# Patient Record
Sex: Female | Born: 1990 | Race: Asian | Hispanic: No | Marital: Married | Smoking: Never smoker
Health system: Southern US, Community
[De-identification: ages and names within clinical notes are randomized; demographics above are authoritative.]

## PROBLEM LIST (undated history)

## (undated) DIAGNOSIS — Q963 Mosaicism, 45, X/46, XX or XY: Secondary | ICD-10-CM

## (undated) DIAGNOSIS — D649 Anemia, unspecified: Secondary | ICD-10-CM

## (undated) DIAGNOSIS — O4400 Placenta previa specified as without hemorrhage, unspecified trimester: Secondary | ICD-10-CM

## (undated) HISTORY — DX: Complete placenta previa nos or without hemorrhage, unspecified trimester: O44.00

## (undated) HISTORY — DX: Mosaicism, 45, X/46, XX or XY: Q96.3

## (undated) HISTORY — PX: APPENDECTOMY: SHX54

## (undated) HISTORY — DX: Anemia, unspecified: D64.9

## (undated) HISTORY — PX: ADENOIDECTOMY: SUR15

---

## 2020-04-03 DIAGNOSIS — Z1331 Encounter for screening for depression: Secondary | ICD-10-CM | POA: Diagnosis not present

## 2020-04-03 DIAGNOSIS — Z01419 Encounter for gynecological examination (general) (routine) without abnormal findings: Secondary | ICD-10-CM | POA: Diagnosis not present

## 2020-04-13 DIAGNOSIS — Z20822 Contact with and (suspected) exposure to covid-19: Secondary | ICD-10-CM | POA: Diagnosis not present

## 2020-05-11 DIAGNOSIS — Z20822 Contact with and (suspected) exposure to covid-19: Secondary | ICD-10-CM | POA: Diagnosis not present

## 2020-05-11 DIAGNOSIS — Z03818 Encounter for observation for suspected exposure to other biological agents ruled out: Secondary | ICD-10-CM | POA: Diagnosis not present

## 2020-05-16 DIAGNOSIS — K353 Acute appendicitis with localized peritonitis, without perforation or gangrene: Secondary | ICD-10-CM | POA: Diagnosis not present

## 2020-05-16 DIAGNOSIS — K3532 Acute appendicitis with perforation and localized peritonitis, without abscess: Secondary | ICD-10-CM | POA: Diagnosis not present

## 2020-05-16 DIAGNOSIS — Z98891 History of uterine scar from previous surgery: Secondary | ICD-10-CM | POA: Diagnosis not present

## 2020-05-16 DIAGNOSIS — K3580 Unspecified acute appendicitis: Secondary | ICD-10-CM | POA: Diagnosis not present

## 2020-05-16 DIAGNOSIS — D72829 Elevated white blood cell count, unspecified: Secondary | ICD-10-CM | POA: Diagnosis not present

## 2020-07-03 DIAGNOSIS — R509 Fever, unspecified: Secondary | ICD-10-CM | POA: Diagnosis not present

## 2020-07-03 DIAGNOSIS — Z20828 Contact with and (suspected) exposure to other viral communicable diseases: Secondary | ICD-10-CM | POA: Diagnosis not present

## 2020-07-03 DIAGNOSIS — J029 Acute pharyngitis, unspecified: Secondary | ICD-10-CM | POA: Diagnosis not present

## 2020-12-25 DIAGNOSIS — Z30432 Encounter for removal of intrauterine contraceptive device: Secondary | ICD-10-CM | POA: Diagnosis not present

## 2021-08-03 ENCOUNTER — Other Ambulatory Visit: Payer: Self-pay | Admitting: *Deleted

## 2021-08-03 DIAGNOSIS — Z8759 Personal history of other complications of pregnancy, childbirth and the puerperium: Secondary | ICD-10-CM | POA: Insufficient documentation

## 2021-08-03 DIAGNOSIS — Z3480 Encounter for supervision of other normal pregnancy, unspecified trimester: Secondary | ICD-10-CM | POA: Insufficient documentation

## 2021-08-16 ENCOUNTER — Encounter: Payer: Self-pay | Admitting: *Deleted

## 2021-08-24 ENCOUNTER — Encounter: Payer: Self-pay | Admitting: *Deleted

## 2021-08-25 ENCOUNTER — Ambulatory Visit (INDEPENDENT_AMBULATORY_CARE_PROVIDER_SITE_OTHER): Payer: BC Managed Care – PPO | Admitting: Advanced Practice Midwife

## 2021-08-25 ENCOUNTER — Other Ambulatory Visit: Payer: Self-pay

## 2021-08-25 ENCOUNTER — Other Ambulatory Visit (HOSPITAL_COMMUNITY)
Admission: RE | Admit: 2021-08-25 | Discharge: 2021-08-25 | Disposition: A | Discharge status: Home / Self Care | Payer: BC Managed Care – PPO | Source: Ambulatory Visit | Attending: Advanced Practice Midwife | Admitting: Advanced Practice Midwife

## 2021-08-25 ENCOUNTER — Encounter: Payer: Self-pay | Admitting: Advanced Practice Midwife

## 2021-08-25 VITALS — BP 121/75 | HR 100 | Ht 64.0 in | Wt 159.0 lb

## 2021-08-25 DIAGNOSIS — Z3480 Encounter for supervision of other normal pregnancy, unspecified trimester: Secondary | ICD-10-CM | POA: Diagnosis not present

## 2021-08-25 DIAGNOSIS — Z98891 History of uterine scar from previous surgery: Secondary | ICD-10-CM | POA: Insufficient documentation

## 2021-08-25 DIAGNOSIS — O09891 Supervision of other high risk pregnancies, first trimester: Secondary | ICD-10-CM

## 2021-08-25 DIAGNOSIS — Z2839 Other underimmunization status: Secondary | ICD-10-CM

## 2021-08-25 DIAGNOSIS — Z8759 Personal history of other complications of pregnancy, childbirth and the puerperium: Secondary | ICD-10-CM | POA: Diagnosis not present

## 2021-08-25 DIAGNOSIS — Z3A08 8 weeks gestation of pregnancy: Secondary | ICD-10-CM | POA: Diagnosis not present

## 2021-08-25 DIAGNOSIS — O09899 Supervision of other high risk pregnancies, unspecified trimester: Secondary | ICD-10-CM

## 2021-08-25 NOTE — Progress Notes (Signed)
?  ? ?Subjective:  ? ?Brianna Farnan is a 31 y.o. G2P1001 at [redacted]w[redacted]d by LMP, c/w bedside US in office today, being seen today for her first obstetrical visit.  Her obstetrical history is significant for  partial abruption, SGA baby, and placenta with area of necrosis at delivery with previous pregnancy, cesarean section for fetal intolerance of labor   and has Supervision of other normal pregnancy, antepartum and History of placenta previa on their problem list.. Patient does intend to breast feed. Pregnancy history fully reviewed. ? ?Patient reports nausea. ? ?HISTORY: ?OB History  ?Gravida Para Term Preterm AB Living  ?2 1 1  0 0 1  ?SAB IAB Ectopic Multiple Live Births  ?0 0 0 0 1  ?  ?# Outcome Date GA Lbr Len/2nd Weight Sex Delivery Anes PTL Lv  ?2 Current           ?1 Term 11/25/18     CS-LTranv   LIV  ? ?Past Medical History:  ?Diagnosis Date  ?? Anemia   ?? Placenta previa   ? ?Past Surgical History:  ?Procedure Laterality Date  ?? APPENDECTOMY    ? ?Family History  ?Problem Relation Age of Onset  ?? Diabetes Mother   ?? Hyperthyroidism Mother   ?? Hypertension Father   ? ?Social History  ? ?Tobacco Use  ?? Smoking status: Never  ?? Smokeless tobacco: Never  ?Vaping Use  ?? Vaping Use: Never used  ?Substance Use Topics  ?? Alcohol use: Not Currently  ?? Drug use: Never  ? ?Not on File ?No current outpatient medications on file prior to visit.  ? ?No current facility-administered medications on file prior to visit.  ? ? ? Indications for ASA therapy (per uptodate) ?One of the following: ?Previous pregnancy with preeclampsia, especially early onset and with an adverse outcome No ?Multifetal gestation No ?Chronic hypertension No ?Type 1 or 2 diabetes mellitus No ?Chronic kidney disease No ?Autoimmune disease (antiphospholipid syndrome, systemic lupus erythematosus) No ? ? Two or more of the following: ?Nulliparity No ?Obesity (body mass index >30 kg/m2) No ?Family history of preeclampsia in mother or sister No ?Age  ?35 years No ?Sociodemographic characteristics (African American race, low socioeconomic level) No ?Personal risk factors (eg, previous pregnancy with low birth weight or small for gestational age infant, previous adverse pregnancy outcome [eg, stillbirth], interval >10 years between pregnancies) No ? ? Indications for early 1 hour GTT (per uptodate) ? ?BMI >25 (>23 in Asian women) AND one of the following ? ?Gestational diabetes mellitus in a previous pregnancy No ?Glycated hemoglobin ?5.7 percent (39 mmol/mol), impaired glucose tolerance, or impaired fasting glucose on previous testing No ?First-degree relative with diabetes No ?High-risk race/ethnicity (eg, African American, Latino, Native American, [place] American, Pacific Islander) No ?History of cardiovascular disease No ?Hypertension or on therapy for hypertension No ?High-density lipoprotein cholesterol level <35 mg/dL (2.11 mmol/L) and/or a triglyceride level >250 mg/dL (1.55 mmol/L) No ?Polycystic ovary syndrome No ?Physical inactivity No ?Other clinical condition associated with insulin resistance (eg, severe obesity, acanthosis nigricans) No ?Previous birth of an infant weighing ?4000 g No ?Previous stillbirth of unknown cause No ?Exam  ? ?Vitals:  ? 08/24/21 0823 08/25/21 1425  ?BP:  121/75  ?Pulse:  100  ?Weight:  159 lb (72.1 kg)  ?Height: 5\' 4"  (1.626 m)   ? ?  ? ?Uterus:     ?Pelvic Exam: Perineum: no hemorrhoids, normal perineum  ? Vulva: normal external genitalia, no lesions  ? Vagina:  normal mucosa,  normal discharge  ? Cervix: no lesions and normal, pap smear done.   ? Adnexa: normal adnexa and no mass, fullness, tenderness  ? Bony Pelvis: average  ?System: General: well-developed, well-nourished female in no acute distress  ? Breast:  normal appearance, no masses or tenderness  ? Skin: normal coloration and turgor, no rashes  ? Neurologic: oriented, normal, negative, normal mood  ? Extremities: normal strength, tone, and muscle mass, ROM of all  joints is normal  ? HEENT PERRLA, extraocular movement intact and sclera clear, anicteric  ? Mouth/Teeth mucous membranes moist, pharynx normal without lesions and dental hygiene good  ? Neck supple and no masses  ? Cardiovascular: regular rate and rhythm  ? Respiratory:  no respiratory distress, normal breath sounds  ? Abdomen: soft, non-tender; bowel sounds normal; no masses,  no organomegaly  ? ?  ?Assessment:  ? ?Pregnancy: G2P1001 ?Patient Active Problem List  ? Diagnosis Date Noted  ?? Supervision of other normal pregnancy, antepartum 08/03/2021  ?? History of placenta previa 08/03/2021  ? ?  ?Plan:  ? ?1. Supervision of other normal pregnancy, antepartum ?--Anticipatory guidance about next visits/weeks of pregnancy given.  ?--next appt in 4 weeks ?--NIPS labs in 3-4 weeks ?--Pt with Pap at previous provider, will review records request additional records PRN ? ?- Culture, OB Urine ?- Obstetric panel ?- HIV antibody (with reflex) ?- Hepatitis C Antibody ?- [facility] AMB BABYSCRIPTS OPT IN ?- GC/Chlamydia probe amp ([place])not at [facility] ? ?2. History of placenta abruption ?--Pt with bleeding prior to delivery, on light duty then modified bed rest, partial previa resolved but 25% abruption noted on [place].  Area of necrosis noted in placenta at time of delivery. Full term infant 5lb 8 oz did well.  ? ?3. History of cesarean delivery ?--For fetal intolerance of labor.  Currently desires repeat C/S but may want to discuss options.  ? ?Initial labs drawn. ?Continue prenatal vitamins. ?Discussed and offered genetic screening options, including Quad screen/AFP, NIPS testing, and option to decline testing. Benefits/risks/alternatives reviewed. Pt aware that anatomy US is form of genetic screening with lower accuracy in detecting trisomies than blood work.  Pt chooses genetic screening today. NIPS: requested. ?Ultrasound discussed; fetal anatomic survey: requested. ?Problem list reviewed and updated. ?The nature of [place] with multiple MDs and other Advanced Practice Providers was explained to patient; also emphasized that residents, students are part of our team. ?Routine obstetric precautions reviewed. ?Return in about 4 weeks (around 09/22/2021). ? ? ?Fatima Blank, CNM ?08/25/21 ?5:29 PM  ?

## 2021-08-26 LAB — GC/CHLAMYDIA PROBE AMP (~~LOC~~) NOT AT ARMC
Chlamydia: NEGATIVE
Comment: NEGATIVE
Comment: NORMAL
Neisseria Gonorrhea: NEGATIVE

## 2021-08-27 LAB — OBSTETRIC PANEL
Absolute Monocytes: 491 cells/uL (ref 200–950)
Antibody Screen: NOT DETECTED
Basophils Absolute: 44 cells/uL (ref 0–200)
Basophils Relative: 0.4 %
Eosinophils Absolute: 185 cells/uL (ref 15–500)
Eosinophils Relative: 1.7 %
HCT: 40.5 % (ref 35.0–45.0)
Hemoglobin: 13.8 g/dL (ref 11.7–15.5)
Hepatitis B Surface Ag: NONREACTIVE
Lymphs Abs: 2006 cells/uL (ref 850–3900)
MCH: 29.6 pg (ref 27.0–33.0)
MCHC: 34.1 g/dL (ref 32.0–36.0)
MCV: 86.9 fL (ref 80.0–100.0)
MPV: 10.3 fL (ref 7.5–12.5)
Monocytes Relative: 4.5 %
Neutro Abs: 8175 cells/uL — ABNORMAL HIGH (ref 1500–7800)
Neutrophils Relative %: 75 %
Platelets: 354 10*3/uL (ref 140–400)
RBC: 4.66 10*6/uL (ref 3.80–5.10)
RDW: 11.8 % (ref 11.0–15.0)
RPR Ser Ql: NONREACTIVE
Rubella: <0.9 Index — ABNORMAL LOW
Total Lymphocyte: 18.4 %
WBC: 10.9 10*3/uL — ABNORMAL HIGH (ref 3.8–10.8)

## 2021-08-27 LAB — HIV ANTIBODY (ROUTINE TESTING W REFLEX): HIV 1&2 Ab, 4th Generation: NONREACTIVE

## 2021-08-27 LAB — HEPATITIS C ANTIBODY
Hepatitis C Ab: NONREACTIVE
SIGNAL TO CUT-OFF: <0.02 (ref <1.00)

## 2021-08-28 LAB — CULTURE, OB URINE

## 2021-08-28 LAB — URINE CULTURE, OB REFLEX

## 2021-08-30 DIAGNOSIS — O09899 Supervision of other high risk pregnancies, unspecified trimester: Secondary | ICD-10-CM | POA: Insufficient documentation

## 2021-08-30 DIAGNOSIS — Z2839 Other underimmunization status: Secondary | ICD-10-CM | POA: Insufficient documentation

## 2021-08-31 NOTE — Progress Notes (Signed)
Bedside U/S shows single IUP with FHT of 152 BPM and CRL measuring 15.36mm  GA is 8 weeks. ?

## 2021-09-08 ENCOUNTER — Encounter: Payer: Self-pay | Admitting: Advanced Practice Midwife

## 2021-09-15 ENCOUNTER — Ambulatory Visit (INDEPENDENT_AMBULATORY_CARE_PROVIDER_SITE_OTHER): Payer: BC Managed Care – PPO | Admitting: Family Medicine

## 2021-09-15 ENCOUNTER — Encounter: Payer: Self-pay | Admitting: Family Medicine

## 2021-09-15 ENCOUNTER — Other Ambulatory Visit: Payer: Self-pay

## 2021-09-15 DIAGNOSIS — J302 Other seasonal allergic rhinitis: Secondary | ICD-10-CM | POA: Insufficient documentation

## 2021-09-15 NOTE — Progress Notes (Signed)
?Brianna Acosta - 31 y.o. female MRN [id number]  Date of birth: [birth year] ? ?Subjective ?Chief Complaint  ?Patient presents with  ? Establish Care  ? ? ?HPI ?Brianna Acosta is a 31 year old female here today for initial visit to establish care.  She and her husband recently moved here from [place].  She has been in good health.  She is currently [redacted] weeks pregnant.  She has established with OB/GYN.  Pregnancy is going well so far.  She is taking prenatal vitamin.  She has had some issues with allergies and would like to consider having allergy testing once her current pregnancy is completed. ? ?ROS:  A comprehensive ROS was completed and negative except as noted per HPI ? ?No Known Allergies ? ?Past Medical History:  ?Diagnosis Date  ? Anemia   ? Placenta previa   ? ? ?Past Surgical History:  ?Procedure Laterality Date  ? APPENDECTOMY    ? CESAREAN SECTION    ? ? ?Social History  ? ?Socioeconomic History  ? Marital status: Married  ?  Spouse name: Not on file  ? Number of children: Not on file  ? Years of education: Not on file  ? Highest education level: Not on file  ?Occupational History  ? Not on file  ?Tobacco Use  ? Smoking status: Never  ?  Passive exposure: Never  ? Smokeless tobacco: Never  ?Vaping Use  ? Vaping Use: Never used  ?Substance and Sexual Activity  ? Alcohol use: Not Currently  ? Drug use: Never  ? Sexual activity: Yes  ?  Partners: Male  ?  Birth control/protection: None  ?Other Topics Concern  ? Not on file  ?Social History Narrative  ? Not on file  ? ?Social Determinants of Health  ? ?Financial Resource Strain: Not on file  ?Food Insecurity: Not on file  ?Transportation Needs: Not on file  ?Physical Activity: Not on file  ?Stress: Not on file  ?Social Connections: Not on file  ? ? ?Family History  ?Problem Relation Age of Onset  ? Diabetes Mother   ? Hyperthyroidism Mother   ? Hypertension Father   ? Cancer Maternal Grandmother   ? ? ?Health Maintenance  ?Topic Date Due  ? PAP SMEAR-Modifier  Never done   ? COVID-19 Vaccine (4 - Booster for Pfizer series) 09/12/2020  ? TETANUS/TDAP  10/16/2028  ? INFLUENZA VACCINE  Completed  ? HPV VACCINES  Completed  ? Hepatitis C Screening  Completed  ? HIV Screening  Completed  ? ? ? ?----------------------------------------------------------------------------------------------------------------------------------------------------------------------------------------------------------------- ?Physical Exam ?BP 100/67 (BP Location: Left Arm, Patient Position: Sitting, Cuff Size: Normal)   Pulse 87   Ht 5' 4.17" (1.63 m)   Wt 159 lb 8 oz (72.3 kg)   LMP 06/27/2021   SpO2 100%   BMI 27.23 kg/m?  ? ?Physical Exam ?Constitutional:   ?   Appearance: Normal appearance.  ?Eyes:  ?   General: No scleral icterus. ?Cardiovascular:  ?   Rate and Rhythm: Normal rate and regular rhythm.  ?Pulmonary:  ?   Effort: Pulmonary effort is normal.  ?   Breath sounds: Normal breath sounds.  ?Musculoskeletal:  ?   Cervical back: Neck supple.  ?Neurological:  ?   Mental Status: She is alert.  ?Psychiatric:     ?   Mood and Affect: Mood normal.     ?   Behavior: Behavior normal.  ? ? ?------------------------------------------------------------------------------------------------------------------------------------------------------------------------------------------------------------------- ?Assessment and Plan ? ?Seasonal allergies ?She may use over-the-counter antihistamine such as cetirizine or  loratadine as needed for for now.  We will plan for referral to allergist postpartum. ? ? ?No orders of the defined types were placed in this encounter. ? ? ?No follow-ups on file. ? ? ? ?This visit occurred during the SARS-CoV-2 public health emergency.  Safety protocols were in place, including screening questions prior to the visit, additional usage of staff PPE, and extensive cleaning of exam room while observing appropriate contact time as indicated for disinfecting solutions.  ? ?

## 2021-09-15 NOTE — Patient Instructions (Signed)
Great to meet you today! ?Let's plan to pursue allergy testing after pregnancy.  ?Please contact us if you have additional questions or concerns.  ?

## 2021-09-15 NOTE — Assessment & Plan Note (Signed)
She may use over-the-counter antihistamine such as cetirizine or loratadine as needed for for now.  We will plan for referral to allergist postpartum. ?

## 2021-09-21 ENCOUNTER — Ambulatory Visit (INDEPENDENT_AMBULATORY_CARE_PROVIDER_SITE_OTHER): Payer: BC Managed Care – PPO | Admitting: Obstetrics & Gynecology

## 2021-09-21 ENCOUNTER — Encounter: Payer: Self-pay | Admitting: Obstetrics & Gynecology

## 2021-09-21 VITALS — BP 110/73 | HR 102 | Wt 159.0 lb

## 2021-09-21 DIAGNOSIS — O285 Abnormal chromosomal and genetic finding on antenatal screening of mother: Secondary | ICD-10-CM

## 2021-09-21 DIAGNOSIS — Z3482 Encounter for supervision of other normal pregnancy, second trimester: Secondary | ICD-10-CM | POA: Diagnosis not present

## 2021-09-21 DIAGNOSIS — Z3480 Encounter for supervision of other normal pregnancy, unspecified trimester: Secondary | ICD-10-CM

## 2021-09-21 DIAGNOSIS — Z3A12 12 weeks gestation of pregnancy: Secondary | ICD-10-CM

## 2021-09-21 NOTE — Progress Notes (Signed)
? ?  PRENATAL VISIT NOTE ? ?Subjective:  ?Brianna Acosta is a 30 y.o. G2P1001 at [redacted]w[redacted]d being seen today for ongoing prenatal care.  She is currently monitored for the following issues for this low-risk pregnancy and has Supervision of other normal pregnancy, antepartum; History of placenta previa; History of cesarean delivery; History of placenta abruption; Rubella non-immune status, antepartum; and Seasonal allergies on their problem list. ? ?Patient reports no complaints.  Contractions: Not present. Vag. Bleeding: None.  Movement: Absent. Denies leaking of fluid.  ? ?The following portions of the patient's history were reviewed and updated as appropriate: allergies, current medications, past family history, past medical history, past social history, past surgical history and problem list.  ? ?Objective:  ? ?Vitals:  ? 09/21/21 1303  ?BP: 110/73  ?Pulse: (!) 102  ?Weight: 159 lb (72.1 kg)  ? ? ?Fetal Status: Fetal Heart Rate (bpm): 171   Movement: Absent    ? ?General:  Alert, oriented and cooperative. Patient is in no acute distress.  ?Skin: Skin is warm and dry. No rash noted.   ?Cardiovascular: Normal heart rate noted  ?Respiratory: Normal respiratory effort, no problems with respiration noted  ?Abdomen: Soft, gravid, appropriate for gestational age.  Pain/Pressure: Absent     ?Pelvic: Cervical exam deferred        ?Extremities: Normal range of motion.  Edema: None  ?Mental Status: Normal mood and affect. Normal behavior. Normal judgment and thought content.  ? ?Assessment and Plan:  ?Pregnancy: G2P1001 at [redacted]w[redacted]d ?1. [redacted] weeks gestation of pregnancy ?2. Supervision of other normal pregnancy, antepartum ?NIPS and Horizon to be done today, will follow up results and manage accordingly. ?Anatomy scan to be scheduled. ?- [place] MFM OB COMP + 14 WK; Future ?- Genetic Screening ?All questions answered.  No other complaints or concerns.  Routine obstetric precautions reviewed. ?Please refer to After Visit Summary for other  counseling recommendations.  ? ?Return in about 4 weeks (around 10/19/2021) for OFFICE OB VISIT (MD or APP), possible AFP check. ? ?No future appointments. ? ?Verita Schneiders, MD ? ?

## 2021-09-22 ENCOUNTER — Telehealth: Payer: Self-pay | Admitting: *Deleted

## 2021-09-22 ENCOUNTER — Encounter: Payer: Self-pay | Admitting: Advanced Practice Midwife

## 2021-09-22 NOTE — Telephone Encounter (Signed)
Patient left a message at 8:40 AM to send a dental letter for clearance to see her dentist today around 11:00/11:30 AM due to her being pregnant. Patient stated to send to her MyChart. ?

## 2021-09-29 ENCOUNTER — Encounter: Payer: Self-pay | Admitting: *Deleted

## 2021-09-29 ENCOUNTER — Telehealth: Payer: Self-pay

## 2021-09-29 DIAGNOSIS — O285 Abnormal chromosomal and genetic finding on antenatal screening of mother: Secondary | ICD-10-CM | POA: Insufficient documentation

## 2021-09-29 DIAGNOSIS — Z3480 Encounter for supervision of other normal pregnancy, unspecified trimester: Secondary | ICD-10-CM

## 2021-09-29 NOTE — Addendum Note (Signed)
Addended by: Verita Schneiders A on: 09/29/2021 08:35 AM ? ? Modules accepted: Orders ? ?

## 2021-09-29 NOTE — Telephone Encounter (Signed)
Called patient regarding genetic screening results. Before results were discussed, patient name and DOB verified. Reviewed results of atypical fetal sex. Patient has appointment with genetics counselor tomorrow 4/12 at 1030am. All questions answered.  ? ?

## 2021-09-29 NOTE — Progress Notes (Signed)
Abnormal Panorama and MFM genetics referral placed ?

## 2021-09-30 ENCOUNTER — Ambulatory Visit: Payer: BC Managed Care – PPO | Attending: Obstetrics & Gynecology

## 2021-09-30 ENCOUNTER — Ambulatory Visit: Payer: BC Managed Care – PPO

## 2021-09-30 ENCOUNTER — Encounter: Payer: Self-pay | Admitting: *Deleted

## 2021-09-30 DIAGNOSIS — O285 Abnormal chromosomal and genetic finding on antenatal screening of mother: Secondary | ICD-10-CM

## 2021-09-30 DIAGNOSIS — Z3480 Encounter for supervision of other normal pregnancy, unspecified trimester: Secondary | ICD-10-CM

## 2021-09-30 DIAGNOSIS — Z1371 Encounter for nonprocreative screening for genetic disease carrier status: Secondary | ICD-10-CM

## 2021-09-30 DIAGNOSIS — Q998 Other specified chromosome abnormalities: Secondary | ICD-10-CM | POA: Diagnosis not present

## 2021-10-01 NOTE — Progress Notes (Signed)
?  Name: Brianna Acosta Indication: Atypical NIPS with suspected maternal finding  ?DOB: 1990-06-25 Age: 31 y.o.   ?EDC: 04/03/2022 LMP: 06/27/2021 Referring Provider:  ?Tereso Newcomer, MD  ?EGA: [redacted]w[redacted]d Genetic Counselor: ?Teena Dunk, MS, CGC  ?OB Hx: G2P1001 Date of Appointment: 09/30/2021  ?Accompanied by: ?Father of the pregnancy, Ellory Khurana ?[employer] Genetic Counseling Student Face to Face Time: 30 Minutes  ? ?Medical History:  ?This is Andera's 2nd pregnancy. She has one living, healthy daughter. ?Reports she takes prenatal vitamins, DHA, and Claritin. ?Denies personal history of diabetes, high blood pressure, thyroid conditions, and seizures. ?Denies bleeding, infections, and fevers in the current pregnancy. ?Denies using tobacco, alcohol, or street drugs in the current pregnancy.  ? ?Family History: A pedigree was created and scanned into Epic under the Media tab. ?Tomicka's paternal uncle died in the first or second year of life from hydrocephalus. ?Corinna has identical twin brothers. One of her brothers had seizures in childhood which resolved. The other twin has not had a seizure. Jermany reports she has never had a seizure. ?Tongela's mother had 5 miscarriages.  ?Maternal ethnicity reported as Nepalis/Asian and paternal ethnicity reported as Caucasian. ?Denies Ashkenazi Jewish ancestry. ?Family history not remarkable for consanguinity, individuals with birth defects, intellectual disability, autism spectrum disorder, still births, or unexplained neonatal death.  ?   ?Genetic Counseling:  ? ?Atypical Non-Invasive Prenatal Screen Result. Storie previously completed Non-Invasive Prenatal Screening (NIPS) in this pregnancy (scanned into Epic under the Media tab). The result is low risk for Down syndrome, Trisomy 18, and Trisomy 13. Fetal risk assessment for monosomy X could not be performed. 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 The laboratory reports that in their experience, approximately 94% of the patients who receive this result will be found to have a sex chromosome abnormality. If follow-up testing is to be ordered, the laboratory recommends a karyotype and/or a microarray for Onesty and the fetus. Genetic counseling reviewed this result with Shatiqua and offered her the option to have her blood drawn for a maternal 5 cell count karyotype and microarray analysis. The karyotype will help to identify large structural chromosome abnormalities and the microarray will help to identify smaller copy number variants not visible on a karyotype. Marnee accepted this testing. Genetic counseling also offered Rickiya CVS/amniocentesis for fetal chromosome analysis and microarray studies. Possible procedural difficulties and complications that can arise with these procedures include maternal infection, cramping, bleeding, fluid leakage, and/or pregnancy loss. The risk for pregnancy loss with a CVS/amniocentesis is 1/500. Per the [employer]necologists (ACOG) Practice Bulletin 162, all pregnant women should be offered prenatal assessment for aneuploidy by diagnostic testing regardless of maternal age or other risk factors. ?  ? ?Patient Plan: ? ?Proceed with:  ?Carrier screening already drawn and currently pending ?Maternal 5 cell count karyotype and maternal microarray drawn today (09/30/21) ?Informed consent was obtained. All questions were answered. ? ?Declined: CVS/Amniocentesis  ? ?Thank you for sharing in the care of Ragna with [place].  ?Please do not hesitate to contact us if you have any questions. ? ?Teena Dunk, MS, CGC ?Certified Genetic Counselor ? ?

## 2021-10-02 ENCOUNTER — Telehealth: Payer: Self-pay | Admitting: Genetics

## 2021-10-02 NOTE — Telephone Encounter (Signed)
Brianna Acosta was contacted by telephone to review their carrier screening results. The results are screen negative for Cystic Fibrosis, Spinal Muscular Atrophy, Alpha Thalassemia, and Beta Hemoglobinopathies. A negative result on carrier screening reduces the likelihood of being a carrier, however, does not entirely rule out the possibility. All questions answered.  ? ?

## 2021-10-19 ENCOUNTER — Ambulatory Visit (INDEPENDENT_AMBULATORY_CARE_PROVIDER_SITE_OTHER): Payer: BC Managed Care – PPO | Admitting: Obstetrics & Gynecology

## 2021-10-19 VITALS — BP 123/85 | HR 103 | Wt 164.0 lb

## 2021-10-19 DIAGNOSIS — O09899 Supervision of other high risk pregnancies, unspecified trimester: Secondary | ICD-10-CM

## 2021-10-19 DIAGNOSIS — Z98891 History of uterine scar from previous surgery: Secondary | ICD-10-CM

## 2021-10-19 DIAGNOSIS — O285 Abnormal chromosomal and genetic finding on antenatal screening of mother: Secondary | ICD-10-CM

## 2021-10-19 DIAGNOSIS — Z3A16 16 weeks gestation of pregnancy: Secondary | ICD-10-CM

## 2021-10-19 DIAGNOSIS — Z2839 Other underimmunization status: Secondary | ICD-10-CM

## 2021-10-19 NOTE — Progress Notes (Signed)
? ?  PRENATAL VISIT NOTE ? ?Subjective:  ?Brianna Acosta is a 31 y.o. G2P1001 at [redacted]w[redacted]d being seen today for ongoing prenatal care.  She is currently monitored for the following issues for this low-risk pregnancy and has Supervision of other normal pregnancy, antepartum; History of placenta previa; History of cesarean delivery; History of placenta abruption; Rubella non-immune status, antepartum; Seasonal allergies; and Abnormal chromosomal and genetic finding on antenatal screening mother on their problem list. ? ?Patient reports minor eye twitching.  Contractions: Not present. Vag. Bleeding: None.  Movement: Present. Denies leaking of fluid.  ? ?The following portions of the patient's history were reviewed and updated as appropriate: allergies, current medications, past family history, past medical history, past social history, past surgical history and problem list.  ? ?Objective:  ? ?Vitals:  ? 10/19/21 1307  ?BP: 123/85  ?Pulse: (!) 103  ?Weight: 164 lb (74.4 kg)  ? ? ?Fetal Status:     Movement: Present    ? ?General:  Alert, oriented and cooperative. Patient is in no acute distress.  ?Skin: Skin is warm and dry. No rash noted.   ?Cardiovascular: Normal heart rate noted  ?Respiratory: Normal respiratory effort, no problems with respiration noted  ?Abdomen: Soft, gravid, appropriate for gestational age.  Pain/Pressure: Absent     ?Pelvic: Cervical exam deferred        ?Extremities: Normal range of motion.  Edema: None  ?Mental Status: Normal mood and affect. Normal behavior. Normal judgment and thought content.  ? ?Assessment and Plan:  ?Pregnancy: G2P1001 at [redacted]w[redacted]d ?1. Rubella non-immune status, antepartum ?Rubella vaccine post partum ? ?2. [redacted] weeks gestation of pregnancy ?AFP today ?Genetic--Still waitng for genetic tests to return.  Note sent to Alalyssa Tallas today to see if testing was back.  ?Anatomy US in 2 weeks ? ?Preterm labor symptoms and general obstetric precautions including but not limited to vaginal  bleeding, contractions, leaking of fluid and fetal movement were reviewed in detail with the patient. ?Please refer to After Visit Summary for other counseling recommendations.  ? ?No follow-ups on file. ? ?Future Appointments  ?Date Time Provider Department Center  ?11/17/2021 11:30 AM [facility]  ? ? ?Elsie Lincoln, MD ? ?

## 2021-10-19 NOTE — Progress Notes (Signed)
ROB 16.[redacted] wks GA ?No unusual complaints. ?

## 2021-10-21 LAB — ALPHA FETOPROTEIN, MATERNAL
AFP MoM: 0.57
AFP, Serum: 18 ng/mL
Calc'd Gestational Age: 16.3 weeks
Maternal Wt: 159 [lb_av]
Risk for ONTD: <1
Twins-AFP: 1

## 2021-10-23 LAB — BLOOD CHROM 5 CELL CNT + CMA
Cells Analyzed: 5
Cells Counted: 30
Cells Karyotyped: 2
GTG Band Resolution Achieved: 500

## 2021-10-30 ENCOUNTER — Telehealth: Payer: Self-pay | Admitting: Genetics

## 2021-10-30 NOTE — Telephone Encounter (Signed)
Genetic counseling returned maternal chromosome testing results (drawn on 09/30/2021) to [facility] on 10/23/2021. ? 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 Based on the microarray result, the relative X dosage per cell is equivalent to 40% presence of the single X cell line in WBCs (other tissues may be significantly different). The difference in the amount of mosaicism observed may be explained by differences in testing on cultured cells vs. testing directly on maternal blood.  ? ?Genetic counseling reviewed this result in detail with Lynnita. We discussed that mosaicism of the X chromosome may be associated with highly variable clinical features (from normal to complete Turner syndrome). Given that Shanik has a healthy biological child and that she is currently pregnant we can infer that she does not have complete Turner syndrome. We reviewed that in adult women, age-related loss of an X chromosome in up to 10% of cells is considered normal. We offered her a referral to a genetics clinic that sees adult patients with abnormal genetic testing results for appropriate evaluation and recommendations. Mykel accepted this referral. Genetic counseling is in the process of placing this referral. We reviewed that given this result it is unlikely for the current pregnancy to have sex chromosome aneuploidy, however, we cannot say this with [id number] certainty. Genetic counseling offered [facility] amniocentesis for prenatal diagnosis in the form of a fetal karyotype and microarray. Alzena declined amniocentesis at this time. Auri[facility] is scheduled for a detailed [facility]. ?

## 2021-11-09 ENCOUNTER — Other Ambulatory Visit: Payer: Self-pay | Admitting: Obstetrics & Gynecology

## 2021-11-09 ENCOUNTER — Ambulatory Visit: Payer: BC Managed Care – PPO | Admitting: *Deleted

## 2021-11-09 ENCOUNTER — Ambulatory Visit: Payer: BC Managed Care – PPO | Attending: Obstetrics & Gynecology

## 2021-11-09 ENCOUNTER — Encounter: Payer: Self-pay | Admitting: *Deleted

## 2021-11-09 VITALS — BP 114/72 | HR 108

## 2021-11-09 DIAGNOSIS — Z363 Encounter for antenatal screening for malformations: Secondary | ICD-10-CM | POA: Diagnosis not present

## 2021-11-09 DIAGNOSIS — O09899 Supervision of other high risk pregnancies, unspecified trimester: Secondary | ICD-10-CM

## 2021-11-09 DIAGNOSIS — Z3480 Encounter for supervision of other normal pregnancy, unspecified trimester: Secondary | ICD-10-CM | POA: Insufficient documentation

## 2021-11-09 DIAGNOSIS — Z3A19 19 weeks gestation of pregnancy: Secondary | ICD-10-CM | POA: Diagnosis not present

## 2021-11-09 DIAGNOSIS — O34219 Maternal care for unspecified type scar from previous cesarean delivery: Secondary | ICD-10-CM | POA: Insufficient documentation

## 2021-11-09 DIAGNOSIS — O09292 Supervision of pregnancy with other poor reproductive or obstetric history, second trimester: Secondary | ICD-10-CM | POA: Diagnosis not present

## 2021-11-09 DIAGNOSIS — Z2839 Other underimmunization status: Secondary | ICD-10-CM | POA: Insufficient documentation

## 2021-11-10 ENCOUNTER — Encounter: Payer: Self-pay | Admitting: Obstetrics & Gynecology

## 2021-11-10 ENCOUNTER — Other Ambulatory Visit: Payer: Self-pay | Admitting: *Deleted

## 2021-11-10 DIAGNOSIS — Q963 Mosaicism, 45, X/46, XX or XY: Secondary | ICD-10-CM | POA: Insufficient documentation

## 2021-11-10 DIAGNOSIS — Z362 Encounter for other antenatal screening follow-up: Secondary | ICD-10-CM

## 2021-11-17 ENCOUNTER — Ambulatory Visit: Payer: BC Managed Care – PPO

## 2021-11-19 ENCOUNTER — Encounter: Payer: BC Managed Care – PPO | Admitting: Obstetrics and Gynecology

## 2021-11-19 DIAGNOSIS — Z6791 Unspecified blood type, Rh negative: Secondary | ICD-10-CM | POA: Insufficient documentation

## 2021-11-19 DIAGNOSIS — O26899 Other specified pregnancy related conditions, unspecified trimester: Secondary | ICD-10-CM | POA: Insufficient documentation

## 2021-11-19 NOTE — Progress Notes (Signed)
PRENATAL VISIT NOTE  Subjective:  Brianna Acosta is a 31 y.o. G2P1001 at 56w2dbeing seen today for ongoing prenatal care.  She is currently monitored for the following issues for this high-risk pregnancy and has Supervision of other normal pregnancy, antepartum; History of cesarean delivery; History of placenta abruption; Rubella non-immune status, antepartum; Seasonal allergies; Abnormal chromosomal and genetic finding on antenatal screening of mother; Mosaic Turner syndrome of the patient; and Rh negative status during pregnancy on their problem list.  Patient reports  continued sciatica .  Contractions: Not present. Vag. Bleeding: None.  Movement: Present. Denies leaking of fluid.   The following portions of the patient's history were reviewed and updated as appropriate: allergies, current medications, past family history, past medical history, past social history, past surgical history and problem list.   Objective:   Vitals:   11/23/21 0955  BP: 121/75  Pulse: 86  Weight: 173 lb (78.5 kg)    Fetal Status: Fetal Heart Rate (bpm): 150   Movement: Present     General:  Alert, oriented and cooperative. Patient is in no acute distress.  Skin: Skin is warm and dry. No rash noted.   Cardiovascular: Normal heart rate noted  Respiratory: Normal respiratory effort, no problems with respiration noted  Abdomen: Soft, gravid, appropriate for gestational age.  Pain/Pressure: Absent     Pelvic: Cervical exam deferred        Extremities: Normal range of motion.  Edema: None  Mental Status: Normal mood and affect. Normal behavior. Normal judgment and thought content.   Assessment and Plan:  Pregnancy: G2P1001 at 251w2d. Supervision of other normal pregnancy, antepartum - Anatomy incomplete - scheduled for f/u on 6/20  - AFP wnl - Sciatica - stretching not helping.   2. History of cesarean delivery - Discussed plan for MOD - pt desires to avoid delivery on 10/13. She is very superstitious.  Discussed she could decide to deliver prior to that either by c-section or IOL although IOL not preferred. We could check her cervix and decide closer. She would be very open to this. We could plan IOL if cervix favorable prior to 10/13 giving a couple days cushion vs scheduled repeat. Will discuss further closer. Can also do no IOL and schedule repeat c-section on 10/12 and allow time for labor until that time. She appreciated the options, reviewed will schedule closer to 28 weeks.  - They plan no more than 3 kids.   3. Rubella non-immune status, antepartum - MMR after delivery - discussed with pt   4. Rh negative status during pregnancy in second trimester - Rhogam at 281 (spouse is rh pos)  5. 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 Pt declined amnio.  - [facility] pt has the number   Preterm labor symptoms and general obstetric precautions including but not limited to vaginal bleeding, contractions, leaking of fluid and fetal movement were reviewed in detail with the patient. Please refer to After Visit Summary for other counseling recommendations.   No follow-ups on file.  Future Appointments  Date Time Provider [place]6/20/2023  1:15 PM [facility]6/20/2023  1:30 PM [facility] US2 [facility]7/08/2021  9:50 AM LeGuss BundeMD [facility]7/31/2023  8:30 AM LeGuss BundeMD [facility]8/04/2022  9:50 AM BhJulianne HandlerCNM [facility]  02/12/2022  8:30 AM BhJulianne Handler  CNM [facility]    Radene Gunning, MD

## 2021-11-23 ENCOUNTER — Ambulatory Visit (INDEPENDENT_AMBULATORY_CARE_PROVIDER_SITE_OTHER): Payer: BC Managed Care – PPO | Admitting: Obstetrics and Gynecology

## 2021-11-23 ENCOUNTER — Encounter: Payer: Self-pay | Admitting: Obstetrics and Gynecology

## 2021-11-23 VITALS — BP 121/75 | HR 86 | Wt 173.0 lb

## 2021-11-23 DIAGNOSIS — O09899 Supervision of other high risk pregnancies, unspecified trimester: Secondary | ICD-10-CM

## 2021-11-23 DIAGNOSIS — M5430 Sciatica, unspecified side: Secondary | ICD-10-CM

## 2021-11-23 DIAGNOSIS — Z3480 Encounter for supervision of other normal pregnancy, unspecified trimester: Secondary | ICD-10-CM

## 2021-11-23 DIAGNOSIS — O26892 Other specified pregnancy related conditions, second trimester: Secondary | ICD-10-CM

## 2021-11-23 DIAGNOSIS — Z2839 Other underimmunization status: Secondary | ICD-10-CM

## 2021-11-23 DIAGNOSIS — O285 Abnormal chromosomal and genetic finding on antenatal screening of mother: Secondary | ICD-10-CM

## 2021-11-23 DIAGNOSIS — Z6791 Unspecified blood type, Rh negative: Secondary | ICD-10-CM

## 2021-11-23 DIAGNOSIS — Z98891 History of uterine scar from previous surgery: Secondary | ICD-10-CM

## 2021-11-25 DIAGNOSIS — M5430 Sciatica, unspecified side: Secondary | ICD-10-CM | POA: Insufficient documentation

## 2021-11-25 DIAGNOSIS — R250 Abnormal head movements: Secondary | ICD-10-CM | POA: Insufficient documentation

## 2021-11-25 NOTE — Therapy (Signed)
OUTPATIENT PHYSICAL THERAPY THORACOLUMBAR EVALUATION   Patient Name: Brianna Acosta MRN: [id number] DOB:07-Jul-1990, 31 y.o., female Today's Date: 11/26/2021   PT End of Session - 11/26/21 1311     Visit Number 1    Date for PT Re-Evaluation 01/21/22    Authorization Type [employer] COMM PPO    PT Start Time 1233    PT Stop Time 1310    PT Time Calculation (min) 37 min    Activity Tolerance Patient tolerated treatment well    Behavior During Therapy WFL for tasks assessed/performed             Past Medical History:  Diagnosis Date   Anemia    Mosaic Turner syndrome    Placenta previa    Past Surgical History:  Procedure Laterality Date   APPENDECTOMY     CESAREAN SECTION     Patient Active Problem List   Diagnosis Date Noted   Abnormal head movements 11/25/2021   Sciatica, unspecified side 11/25/2021   Rh negative status during pregnancy 11/19/2021   Mosaic Turner syndrome of the patient 11/10/2021   Abnormal chromosomal and genetic finding on antenatal screening of mother 09/29/2021   Seasonal allergies 09/15/2021   Rubella non-immune status, antepartum 08/30/2021   History of cesarean delivery 08/25/2021   History of placenta abruption 08/25/2021   Supervision of other normal pregnancy, antepartum 08/03/2021    PCP: Luetta Nutting, DO  REFERRING PROVIDER: Radene Gunning, MD  REFERRING DIAG:   Rationale for Evaluation and Treatment Rehabilitation  THERAPY DIAG:  Sciatica, unspecified laterality  Muscle weakness (generalized)  Difficulty in walking, not elsewhere classified  Abnormal head movements  Cramp and spasm  ONSET DATE: 09-19-21  SUBJECTIVE:                                                                                                                                                                                           SUBJECTIVE STATEMENT: Patient is currently [redacted] weeks pregnant.  This is her second pregnancy.  She did not  have any issues with sciatic pain with her first pregnancy.  She was initially ruled as a high risk pregnancy due to previa with her first pregnancy but she has been cleared from high risk at this point.  See below for genetic predisposition.  She describes the pain as right low back and right leg.  She works from home but has some flexibility with positioning and can rest if needed. PERTINENT HISTORY:  Brianna Acosta is a 31 y.o. G2P1001 at [redacted]w[redacted]d being seen today for ongoing prenatal care.  She is currently monitored for the following issues  for this high-risk pregnancy and has Supervision of other normal pregnancy, antepartum; History of cesarean delivery; History of placenta abruption; Rubella non-immune status, antepartum; Seasonal allergies; Abnormal chromosomal and genetic finding on antenatal screening of mother; Mosaic Turner syndrome of the patient; and Rh negative status during pregnancy on their problem list.   Patient reports  continued sciatica .  Contractions: Not present. Vag. Bleeding: None.  Movement: Present. Denies leaking of fluid.     PAIN:  Are you having pain? Yes: NPRS scale: 6/10 Pain location: right low back Pain description: aching Aggravating factors: prolonged standing Relieving factors: rest, stretching   PRECAUTIONS: Other: pregancy  WEIGHT BEARING RESTRICTIONS No  FALLS:  Has patient fallen in last 6 months? 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  PLOF: Independent  PATIENT GOALS  To eliminate pain and avoid this worsening throughout pregnancy   OBJECTIVE:   DIAGNOSTIC FINDINGS:  none  PATIENT SURVEYS:  FOTO 53 goal is 60  SCREENING FOR RED FLAGS: Bowel or bladder incontinence: No Spinal tumors: No Cauda equina syndrome: No Compression fracture: No Abdominal aneurysm: No  COGNITION:  Overall cognitive status: Within  functional limits for tasks assessed     SENSATION: WFL  MUSCLE LENGTH: Hamstrings: Right WFL deg; Left WFL deg Thomas test: Right MILD LIMITATION deg; Left MILD LIMITATION deg  POSTURE: rounded shoulders   LUMBAR ROM:   Active  A/PROM  eval  Flexion WNL  Extension WNL  Right lateral flexion WNL  Left lateral flexion  WNL  Right rotation WNL  Left rotation WNL   (Blank rows = not tested)  LOWER EXTREMITY ROM:     All WNL  LOWER EXTREMITY MMT:    Generally 4+ to 5/5 with exception of right hip abduction 4-/5 and right hip ER 4-/5  LUMBAR SPECIAL TESTS:  Straight leg raise test: Negative   GAIT: Distance walked: 50 Assistive device utilized: None Level of assistance: Complete Independence Comments: Normal non antalgic gait    TODAY'S TREATMENT  Initial eval completed and initiated HEP   PATIENT EDUCATION:  Education details: Initiated HEP Person educated: Patient Education method: Consulting civil engineer, Media planner, Verbal cues, and Handouts Education comprehension: verbalized understanding, returned demonstration, and verbal cues required   HOME EXERCISE PROGRAM: Access Code: [email or web address] URL: [email or web address] Date: 11/26/2021 Prepared by: Candyce Churn  Exercises - Standing Hamstring Stretch on Chair  - 1 x daily - 7 x weekly - 1 sets - 2-3 reps - 20 sec hold - Standing Quad Stretch with Table and Chair Support  - 1 x daily - 7 x weekly - 1 sets - 2-3 reps - 20 hold - Small Range Straight Leg Raise  - 1 x daily - 7 x weekly - 2 sets - 10 reps - Sidelying Hip Abduction  - 1 x daily - 7 x weekly - 2 sets - 10 reps - Clamshell with Resistance  - 1 x daily - 7 x weekly - 2 sets - 10 reps - Hooklying Clamshell with Resistance  - 1 x daily - 7 x weekly - 2 sets - 10 reps  ASSESSMENT:  CLINICAL IMPRESSION: Patient is a 31 y.o. female, [redacted] weeks pregnant, who was seen today for physical therapy evaluation and treatment for right side low back  pain and right side sciatica.  She presents with normal lumbar ROM but some flexibility issues in left hip which may be  affecting her right side.  She has normal neuro findings and should respond well to left LE flexibility and core stabilization.     OBJECTIVE IMPAIRMENTS decreased mobility, difficulty walking, decreased strength, increased muscle spasms, and pain.   ACTIVITY LIMITATIONS carrying, lifting, bending, standing, squatting, sleeping, transfers, and dressing  PARTICIPATION LIMITATIONS: meal prep, cleaning, laundry, shopping, community activity, occupation, and yard work  PERSONAL FACTORS 1 comorbidity: currently pregnant  are also affecting patient's functional outcome.   REHAB POTENTIAL: Good  CLINICAL DECISION MAKING: Stable/uncomplicated  EVALUATION COMPLEXITY: Low   GOALS: Goals reviewed with patient? Yes  SHORT TERM GOALS: Target date: 12/24/2021   Patient will be independent with initial HEP  Baseline: Goal status: INITIAL  2.  Pain report to be no greater than 4/10  Baseline:  Goal status: INITIAL   LONG TERM GOALS: Target date: 01/21/2022  Patient to be independent with advanced HEP  Baseline:  Goal status: INITIAL  2.  Patient to report pain no greater than 2/10  Baseline:  Goal status: INITIAL  3.  Patient to be able to tolerate standing on single leg to don pants Baseline:  Goal status: INITIAL  4.  Patient to be able to stand for at least 15 min without exacerbation of low back pain Baseline:  Goal status: INITIAL     PLAN: PT FREQUENCY: 1-2x/week  PT DURATION: 8 weeks  PLANNED INTERVENTIONS: Therapeutic exercises, Therapeutic activity, Neuromuscular re-education, Balance training, Gait training, Patient/Family education, Joint mobilization, Stair training, Aquatic Therapy, Dry Needling, Spinal mobilization, Cryotherapy, Moist heat, Taping, Manual therapy, and Re-evaluation.  PLAN FOR NEXT SESSION: Review HEP and assess for  effectiveness, NuStep, add in appropriate core and right hip strengthening, ice to right hip and buttock if needed.   Anderson Malta B. Margart Zemanek, PT 11/26/21 1:26 PM

## 2021-11-26 ENCOUNTER — Ambulatory Visit: Payer: BC Managed Care – PPO | Attending: Obstetrics and Gynecology

## 2021-11-26 DIAGNOSIS — O26892 Other specified pregnancy related conditions, second trimester: Secondary | ICD-10-CM | POA: Insufficient documentation

## 2021-11-26 DIAGNOSIS — M5430 Sciatica, unspecified side: Secondary | ICD-10-CM | POA: Insufficient documentation

## 2021-11-26 DIAGNOSIS — R252 Cramp and spasm: Secondary | ICD-10-CM | POA: Insufficient documentation

## 2021-11-26 DIAGNOSIS — M5441 Lumbago with sciatica, right side: Secondary | ICD-10-CM | POA: Insufficient documentation

## 2021-11-26 DIAGNOSIS — Z3A21 21 weeks gestation of pregnancy: Secondary | ICD-10-CM | POA: Insufficient documentation

## 2021-11-26 DIAGNOSIS — R262 Difficulty in walking, not elsewhere classified: Secondary | ICD-10-CM | POA: Insufficient documentation

## 2021-11-26 DIAGNOSIS — Z3480 Encounter for supervision of other normal pregnancy, unspecified trimester: Secondary | ICD-10-CM | POA: Insufficient documentation

## 2021-11-26 DIAGNOSIS — O09892 Supervision of other high risk pregnancies, second trimester: Secondary | ICD-10-CM | POA: Diagnosis not present

## 2021-11-26 DIAGNOSIS — O99891 Other specified diseases and conditions complicating pregnancy: Secondary | ICD-10-CM | POA: Insufficient documentation

## 2021-11-26 DIAGNOSIS — M6281 Muscle weakness (generalized): Secondary | ICD-10-CM | POA: Insufficient documentation

## 2021-11-26 DIAGNOSIS — Z6791 Unspecified blood type, Rh negative: Secondary | ICD-10-CM | POA: Insufficient documentation

## 2021-11-26 DIAGNOSIS — R250 Abnormal head movements: Secondary | ICD-10-CM | POA: Insufficient documentation

## 2021-11-27 ENCOUNTER — Ambulatory Visit: Payer: BC Managed Care – PPO | Admitting: Rehabilitative and Restorative Service Providers"

## 2021-12-01 ENCOUNTER — Ambulatory Visit: Payer: BC Managed Care – PPO

## 2021-12-01 DIAGNOSIS — O09892 Supervision of other high risk pregnancies, second trimester: Secondary | ICD-10-CM | POA: Diagnosis not present

## 2021-12-01 DIAGNOSIS — O99891 Other specified diseases and conditions complicating pregnancy: Secondary | ICD-10-CM | POA: Diagnosis not present

## 2021-12-01 DIAGNOSIS — Z6791 Unspecified blood type, Rh negative: Secondary | ICD-10-CM | POA: Diagnosis not present

## 2021-12-01 DIAGNOSIS — M6281 Muscle weakness (generalized): Secondary | ICD-10-CM

## 2021-12-01 DIAGNOSIS — Z3A21 21 weeks gestation of pregnancy: Secondary | ICD-10-CM | POA: Diagnosis not present

## 2021-12-01 DIAGNOSIS — M5441 Lumbago with sciatica, right side: Secondary | ICD-10-CM | POA: Diagnosis not present

## 2021-12-01 DIAGNOSIS — R252 Cramp and spasm: Secondary | ICD-10-CM

## 2021-12-01 DIAGNOSIS — R262 Difficulty in walking, not elsewhere classified: Secondary | ICD-10-CM

## 2021-12-01 DIAGNOSIS — O26892 Other specified pregnancy related conditions, second trimester: Secondary | ICD-10-CM | POA: Diagnosis not present

## 2021-12-01 DIAGNOSIS — M5430 Sciatica, unspecified side: Secondary | ICD-10-CM

## 2021-12-01 NOTE — Therapy (Signed)
OUTPATIENT PHYSICAL THERAPY TREATMENT NOTE   Patient Name: Brianna Acosta MRN: [id number] DOB:1990/08/29, 31 y.o., female Today's Date: 12/01/2021  PCP: Everrett Coombe, DO REFERRING PROVIDER: Milas Hock, MD  END OF SESSION:   PT End of Session - 12/01/21 1151     Visit Number 2    Date for PT Re-Evaluation 01/21/22    Authorization Type [employer] PPO    PT Start Time 1152    PT Stop Time 1227    PT Time Calculation (min) 35 min    Activity Tolerance Patient tolerated treatment well    Behavior During Therapy WFL for tasks assessed/performed             Past Medical History:  Diagnosis Date   Anemia    Mosaic Turner syndrome    Placenta previa    Past Surgical History:  Procedure Laterality Date   APPENDECTOMY     CESAREAN SECTION     Patient Active Problem List   Diagnosis Date Noted   Abnormal head movements 11/25/2021   Sciatica, unspecified side 11/25/2021   Rh negative status during pregnancy 11/19/2021   Mosaic Turner syndrome of the patient 11/10/2021   Abnormal chromosomal and genetic finding on antenatal screening of mother 09/29/2021   Seasonal allergies 09/15/2021   Rubella non-immune status, antepartum 08/30/2021   History of cesarean delivery 08/25/2021   History of placenta abruption 08/25/2021   Supervision of other normal pregnancy, antepartum 08/03/2021    REFERRING DIAG: Sciatica  THERAPY DIAG:  Sciatica, unspecified laterality  Muscle weakness (generalized)  Difficulty in walking, not elsewhere classified  Cramp and spasm  Rationale for Evaluation and Treatment Rehabilitation  PERTINENT HISTORY: See above  PRECAUTIONS: pregnancy  SUBJECTIVE: Patient reports she felt some benefit from most of the stretches but not as much with the hip flexor stretch.  Overall, slightly better.    PAIN:  Are you having pain? Yes: NPRS scale: 4/10 Pain location: Right low back, glut Pain description: dull ache Aggravating  factors: standing Relieving factors: rest, supine   OBJECTIVE: (objective measures completed at initial evaluation unless otherwise dated)  OBJECTIVE:    DIAGNOSTIC FINDINGS:  none   PATIENT SURVEYS:  FOTO 53 goal is 32   SCREENING FOR RED FLAGS: Bowel or bladder incontinence: No Spinal tumors: No Cauda equina syndrome: No Compression fracture: No Abdominal aneurysm: No   COGNITION:           Overall cognitive status: Within functional limits for tasks assessed                          SENSATION: WFL   MUSCLE LENGTH: Hamstrings: Right WFL deg; Left WFL deg Thomas test: Right MILD LIMITATION deg; Left MILD LIMITATION deg   POSTURE: rounded shoulders     LUMBAR ROM:    Active  A/PROM  eval  Flexion WNL  Extension WNL  Right lateral flexion WNL  Left lateral flexion  WNL  Right rotation WNL  Left rotation WNL   (Blank rows = not tested)   LOWER EXTREMITY ROM:      All WNL   LOWER EXTREMITY MMT:     Generally 4+ to 5/5 with exception of right hip abduction 4-/5 and right hip ER 4-/5   LUMBAR SPECIAL TESTS:  Straight leg raise test: Negative     GAIT: Distance walked: 50 Assistive device utilized: None Level of assistance: Complete Independence Comments: Normal non antalgic gait  TODAY'S TREATMENT 12/01/21 NuStep x 5 min Standing hamstring stretch x 3 hold 30 sec Left leg only hip flexor/quad stretch x 3 hold 30 sec Supine butterfly stretch 3 x 30 sec Hooklying clam x 20 with blue loop Side lying clam x 20 each side PPT x 20 PPT with marching heel taps x 20 PPT with SLR and shoulder flexion with plyo ball (blue) x 20 each side Quadruped cat camel x 10 Quadruped alternating arm and leg x 20 Quadruped fire hydrant x 10 each side  TODAY'S TREATMENT  Initial eval completed and initiated HEP     PATIENT EDUCATION:  Education details: Initiated HEP Person educated: Patient Education method: Programmer, multimediaxplanation, Facilities managerDemonstration, Verbal cues, and  Handouts Education comprehension: verbalized understanding, returned demonstration, and verbal cues required     HOME EXERCISE PROGRAM: Access Code: [email or web address] URL: [email or web address] Date: 11/26/2021 Prepared by: Mikey KirschnerJennifer Kristan Votta   Exercises - Standing Hamstring Stretch on Chair  - 1 x daily - 7 x weekly - 1 sets - 2-3 reps - 20 sec hold - Standing Quad Stretch with Table and Chair Support  - 1 x daily - 7 x weekly - 1 sets - 2-3 reps - 20 hold - Small Range Straight Leg Raise  - 1 x daily - 7 x weekly - 2 sets - 10 reps - Sidelying Hip Abduction  - 1 x daily - 7 x weekly - 2 sets - 10 reps - Clamshell with Resistance  - 1 x daily - 7 x weekly - 2 sets - 10 reps - Hooklying Clamshell with Resistance  - 1 x daily - 7 x weekly - 2 sets - 10 reps   ASSESSMENT:   CLINICAL IMPRESSION: Kyan had no adverse effects with initial HEP. We eliminated hip flexor/quad stretch on right due to patient not feeling any stretch.  She did, however feel a stretch on left so we kept this on her HEP.  She was able to do all tasks today without increased pain.   She would benefit from continued skilled PT for pelvic and core stabilization along with flexibility exercises.       OBJECTIVE IMPAIRMENTS decreased mobility, difficulty walking, decreased strength, increased muscle spasms, and pain.    ACTIVITY LIMITATIONS carrying, lifting, bending, standing, squatting, sleeping, transfers, and dressing   PARTICIPATION LIMITATIONS: meal prep, cleaning, laundry, shopping, community activity, occupation, and yard work   PERSONAL FACTORS 1 comorbidity: currently pregnant  are also affecting patient's functional outcome.    REHAB POTENTIAL: Good   CLINICAL DECISION MAKING: Stable/uncomplicated   EVALUATION COMPLEXITY: Low     GOALS: Goals reviewed with patient? Yes   SHORT TERM GOALS: Target date: 12/24/2021     Patient will be independent with initial HEP  Baseline: Goal status:  INITIAL   2.  Pain report to be no greater than 4/10  Baseline:  Goal status: INITIAL     LONG TERM GOALS: Target date: 01/21/2022   Patient to be independent with advanced HEP  Baseline:  Goal status: INITIAL   2.  Patient to report pain no greater than 2/10  Baseline:  Goal status: INITIAL   3.  Patient to be able to tolerate standing on single leg to don pants Baseline:  Goal status: INITIAL   4.  Patient to be able to stand for at least 15 min without exacerbation of low back pain Baseline:  Goal status: INITIAL         PLAN: PT FREQUENCY: 1-2x/week   PT DURATION:  8 weeks   PLANNED INTERVENTIONS: Therapeutic exercises, Therapeutic activity, Neuromuscular re-education, Balance training, Gait training, Patient/Family education, Joint mobilization, Stair training, Aquatic Therapy, Dry Needling, Spinal mobilization, Cryotherapy, Moist heat, Taping, Manual therapy, and Re-evaluation.   PLAN FOR NEXT SESSION: Review HEP and assess for effectiveness, NuStep, add in appropriate core and right hip strengthening, ice to right hip and buttock if needed.      Vernell Barrier, PT 12/01/2021, 12:27 PM

## 2021-12-08 ENCOUNTER — Encounter: Payer: Self-pay | Admitting: *Deleted

## 2021-12-08 ENCOUNTER — Ambulatory Visit: Payer: BC Managed Care – PPO | Admitting: *Deleted

## 2021-12-08 ENCOUNTER — Ambulatory Visit: Payer: BC Managed Care – PPO

## 2021-12-08 ENCOUNTER — Other Ambulatory Visit: Payer: Self-pay | Admitting: *Deleted

## 2021-12-08 ENCOUNTER — Ambulatory Visit: Payer: BC Managed Care – PPO | Attending: Obstetrics and Gynecology

## 2021-12-08 VITALS — BP 113/67 | HR 96

## 2021-12-08 DIAGNOSIS — O34219 Maternal care for unspecified type scar from previous cesarean delivery: Secondary | ICD-10-CM | POA: Diagnosis not present

## 2021-12-08 DIAGNOSIS — Z3A21 21 weeks gestation of pregnancy: Secondary | ICD-10-CM | POA: Diagnosis not present

## 2021-12-08 DIAGNOSIS — Z6791 Unspecified blood type, Rh negative: Secondary | ICD-10-CM | POA: Diagnosis not present

## 2021-12-08 DIAGNOSIS — O09899 Supervision of other high risk pregnancies, unspecified trimester: Secondary | ICD-10-CM | POA: Insufficient documentation

## 2021-12-08 DIAGNOSIS — O09292 Supervision of pregnancy with other poor reproductive or obstetric history, second trimester: Secondary | ICD-10-CM | POA: Diagnosis not present

## 2021-12-08 DIAGNOSIS — Z362 Encounter for other antenatal screening follow-up: Secondary | ICD-10-CM | POA: Diagnosis not present

## 2021-12-08 DIAGNOSIS — O26892 Other specified pregnancy related conditions, second trimester: Secondary | ICD-10-CM | POA: Diagnosis not present

## 2021-12-08 DIAGNOSIS — Z2839 Other underimmunization status: Secondary | ICD-10-CM | POA: Insufficient documentation

## 2021-12-08 DIAGNOSIS — Z8759 Personal history of other complications of pregnancy, childbirth and the puerperium: Secondary | ICD-10-CM

## 2021-12-08 DIAGNOSIS — O3515X0 Maternal care for (suspected) chromosomal abnormality in fetus, sex chromosome abnormality, not applicable or unspecified: Secondary | ICD-10-CM | POA: Diagnosis not present

## 2021-12-08 DIAGNOSIS — R262 Difficulty in walking, not elsewhere classified: Secondary | ICD-10-CM

## 2021-12-08 DIAGNOSIS — Z3A23 23 weeks gestation of pregnancy: Secondary | ICD-10-CM | POA: Diagnosis not present

## 2021-12-08 DIAGNOSIS — O99891 Other specified diseases and conditions complicating pregnancy: Secondary | ICD-10-CM | POA: Diagnosis not present

## 2021-12-08 DIAGNOSIS — M5430 Sciatica, unspecified side: Secondary | ICD-10-CM

## 2021-12-08 DIAGNOSIS — M5441 Lumbago with sciatica, right side: Secondary | ICD-10-CM | POA: Diagnosis not present

## 2021-12-08 DIAGNOSIS — R252 Cramp and spasm: Secondary | ICD-10-CM

## 2021-12-08 DIAGNOSIS — O280 Abnormal hematological finding on antenatal screening of mother: Secondary | ICD-10-CM

## 2021-12-08 DIAGNOSIS — M6281 Muscle weakness (generalized): Secondary | ICD-10-CM

## 2021-12-08 DIAGNOSIS — O09892 Supervision of other high risk pregnancies, second trimester: Secondary | ICD-10-CM | POA: Diagnosis not present

## 2021-12-08 NOTE — Therapy (Signed)
OUTPATIENT PHYSICAL THERAPY TREATMENT NOTE   Patient Name: Brianna Acosta MRN: [id number] DOB:06/19/1991, 31 y.o., female Today's Date: 12/08/2021  PCP: Luetta Nutting, DO REFERRING PROVIDER: Radene Gunning, MD  END OF SESSION:   PT End of Session - 12/08/21 1102     Visit Number 3    Date for PT Re-Evaluation 01/21/22    Authorization Type [employer] COMM PPO    PT Start Time 1102    PT Stop Time 1140    PT Time Calculation (min) 38 min    Activity Tolerance Patient tolerated treatment well    Behavior During Therapy WFL for tasks assessed/performed             Past Medical History:  Diagnosis Date   Anemia    Mosaic Turner syndrome    Placenta previa    Past Surgical History:  Procedure Laterality Date   APPENDECTOMY     CESAREAN SECTION     Patient Active Problem List   Diagnosis Date Noted   Abnormal head movements 11/25/2021   Sciatica, unspecified side 11/25/2021   Rh negative status during pregnancy 11/19/2021   Mosaic Turner syndrome of the patient 11/10/2021   Abnormal chromosomal and genetic finding on antenatal screening of mother 09/29/2021   Seasonal allergies 09/15/2021   Rubella non-immune status, antepartum 08/30/2021   History of cesarean delivery 08/25/2021   History of placenta abruption 08/25/2021   Supervision of other normal pregnancy, antepartum 08/03/2021    REFERRING DIAG: Sciatica  THERAPY DIAG:  Sciatica, unspecified laterality  Muscle weakness (generalized)  Difficulty in walking, not elsewhere classified  Cramp and spasm  Rationale for Evaluation and Treatment Rehabilitation  PERTINENT HISTORY: See above  PRECAUTIONS: pregnancy  SUBJECTIVE: Patient reports she noticed some centralization of her symptoms.  She had a wedding this past weekend where she had to sit in a hard chair on an incline but she felt she did pretty good with this.  Overall slight decrease in pain.    PAIN:  Are you having pain? Yes:  NPRS scale: 3/10 Pain location: Right low back, glut Pain description: dull ache Aggravating factors: standing Relieving factors: rest, supine   OBJECTIVE: (objective measures completed at initial evaluation unless otherwise dated)  OBJECTIVE:    DIAGNOSTIC FINDINGS:  none   PATIENT SURVEYS:  FOTO 53 goal is 63   SCREENING FOR RED FLAGS: Bowel or bladder incontinence: No Spinal tumors: No Cauda equina syndrome: No Compression fracture: No Abdominal aneurysm: No   COGNITION:           Overall cognitive status: Within functional limits for tasks assessed                          SENSATION: WFL   MUSCLE LENGTH: Hamstrings: Right WFL deg; Left WFL deg Thomas test: Right MILD LIMITATION deg; Left MILD LIMITATION deg   POSTURE: rounded shoulders     LUMBAR ROM:    Active  A/PROM  eval  Flexion WNL  Extension WNL  Right lateral flexion WNL  Left lateral flexion  WNL  Right rotation WNL  Left rotation WNL   (Blank rows = not tested)   LOWER EXTREMITY ROM:      All WNL   LOWER EXTREMITY MMT:     Generally 4+ to 5/5 with exception of right hip abduction 4-/5 and right hip ER 4-/5   LUMBAR SPECIAL TESTS:  Straight leg raise test: Negative  GAIT: Distance walked: 50 Assistive device utilized: None Level of assistance: Complete Independence Comments: Normal non antalgic gait       TODAY'S TREATMENT 12/08/21 NuStep x 5 min Standing hamstring stretch x 3 hold 30 sec Bilateral hip flexor/quad stretch x 2 hold 30 sec Supine butterfly stretch 3 x 30 sec Hooklying clam x 20 with blue loop Side lying clam x 20 each side Blue loop Lateral band walks x 10 steps each way for 3 laps with blue loop PPT x 20 PPT with marching heel taps x 20 PPT with SLR and shoulder flexion with plyo ball (blue) x 20 each side Quadruped cat camel x 10 Quadruped alternating arm and leg x 20 Quadruped fire hydrant x 10 each side  TODAY'S TREATMENT 12/01/21 NuStep x 5  min Standing hamstring stretch x 3 hold 30 sec Left leg only hip flexor/quad stretch x 3 hold 30 sec Supine butterfly stretch 3 x 30 sec Hooklying clam x 20 with blue loop Side lying clam x 20 each side PPT x 20 PPT with marching heel taps x 20 PPT with SLR and shoulder flexion with plyo ball (blue) x 20 each side Quadruped cat camel x 10 Quadruped alternating arm and leg x 20 Quadruped fire hydrant x 10 each side  TODAY'S TREATMENT  Initial eval completed and initiated HEP     PATIENT EDUCATION:  Education details: Initiated HEP Person educated: Patient Education method: Programmer, multimedia, Facilities manager, Verbal cues, and Handouts Education comprehension: verbalized understanding, returned demonstration, and verbal cues required     HOME EXERCISE PROGRAM: Access Code: [email or web address] URL: [email or web address] Date: 11/26/2021 Prepared by: Mikey Kirschner   Exercises - Standing Hamstring Stretch on Chair  - 1 x daily - 7 x weekly - 1 sets - 2-3 reps - 20 sec hold - Standing Quad Stretch with Table and Chair Support  - 1 x daily - 7 x weekly - 1 sets - 2-3 reps - 20 hold - Small Range Straight Leg Raise  - 1 x daily - 7 x weekly - 2 sets - 10 reps - Sidelying Hip Abduction  - 1 x daily - 7 x weekly - 2 sets - 10 reps - Clamshell with Resistance  - 1 x daily - 7 x weekly - 2 sets - 10 reps - Hooklying Clamshell with Resistance  - 1 x daily - 7 x weekly - 2 sets - 10 reps   ASSESSMENT:   CLINICAL IMPRESSION: Brianna Acosta is progressing appropriately  She was able to do all tasks today without increased pain.  She continues to have some core weakness but she demonstrates improved control today with core exercises.  Decreased verbal cues to maintain pelvis tilt.    She would benefit from continued skilled PT for pelvic and core stabilization along with flexibility exercises.       OBJECTIVE IMPAIRMENTS decreased mobility, difficulty walking, decreased strength, increased muscle  spasms, and pain.    ACTIVITY LIMITATIONS carrying, lifting, bending, standing, squatting, sleeping, transfers, and dressing   PARTICIPATION LIMITATIONS: meal prep, cleaning, laundry, shopping, community activity, occupation, and yard work   PERSONAL FACTORS 1 comorbidity: currently pregnant  are also affecting patient's functional outcome.    REHAB POTENTIAL: Good   CLINICAL DECISION MAKING: Stable/uncomplicated   EVALUATION COMPLEXITY: Low     GOALS: Goals reviewed with patient? Yes   SHORT TERM GOALS: Target date: 12/24/2021     Patient will be independent with initial HEP  Baseline: Goal status: INITIAL   2.  Pain report to be no greater than 4/10  Baseline:  Goal status: INITIAL     LONG TERM GOALS: Target date: 01/21/2022   Patient to be independent with advanced HEP  Baseline:  Goal status: INITIAL   2.  Patient to report pain no greater than 2/10  Baseline:  Goal status: INITIAL   3.  Patient to be able to tolerate standing on single leg to don pants Baseline:  Goal status: INITIAL   4.  Patient to be able to stand for at least 15 min without exacerbation of low back pain Baseline:  Goal status: INITIAL         PLAN: PT FREQUENCY: 1-2x/week   PT DURATION: 8 weeks   PLANNED INTERVENTIONS: Therapeutic exercises, Therapeutic activity, Neuromuscular re-education, Balance training, Gait training, Patient/Family education, Joint mobilization, Stair training, Aquatic Therapy, Dry Needling, Spinal mobilization, Cryotherapy, Moist heat, Taping, Manual therapy, and Re-evaluation.   PLAN FOR NEXT SESSION: Review HEP and assess for effectiveness, NuStep, add in appropriate core and right hip strengthening, ice to right hip and buttock if needed.      Victorino Dike B. Mahamadou Weltz, PT 12/08/21 1:24 PM

## 2021-12-15 ENCOUNTER — Ambulatory Visit: Payer: BC Managed Care – PPO

## 2021-12-15 DIAGNOSIS — R262 Difficulty in walking, not elsewhere classified: Secondary | ICD-10-CM

## 2021-12-15 DIAGNOSIS — M5430 Sciatica, unspecified side: Secondary | ICD-10-CM

## 2021-12-15 DIAGNOSIS — R252 Cramp and spasm: Secondary | ICD-10-CM

## 2021-12-15 DIAGNOSIS — Z3A21 21 weeks gestation of pregnancy: Secondary | ICD-10-CM | POA: Diagnosis not present

## 2021-12-15 DIAGNOSIS — O09892 Supervision of other high risk pregnancies, second trimester: Secondary | ICD-10-CM | POA: Diagnosis not present

## 2021-12-15 DIAGNOSIS — M6281 Muscle weakness (generalized): Secondary | ICD-10-CM

## 2021-12-15 DIAGNOSIS — M5441 Lumbago with sciatica, right side: Secondary | ICD-10-CM | POA: Diagnosis not present

## 2021-12-15 DIAGNOSIS — O26892 Other specified pregnancy related conditions, second trimester: Secondary | ICD-10-CM | POA: Diagnosis not present

## 2021-12-15 DIAGNOSIS — O99891 Other specified diseases and conditions complicating pregnancy: Secondary | ICD-10-CM | POA: Diagnosis not present

## 2021-12-15 DIAGNOSIS — Z6791 Unspecified blood type, Rh negative: Secondary | ICD-10-CM | POA: Diagnosis not present

## 2021-12-21 ENCOUNTER — Ambulatory Visit (INDEPENDENT_AMBULATORY_CARE_PROVIDER_SITE_OTHER): Payer: BC Managed Care – PPO | Admitting: Obstetrics & Gynecology

## 2021-12-21 VITALS — BP 124/84 | HR 91 | Wt 173.0 lb

## 2021-12-21 DIAGNOSIS — Q963 Mosaicism, 45, X/46, XX or XY: Secondary | ICD-10-CM

## 2021-12-21 DIAGNOSIS — Z6791 Unspecified blood type, Rh negative: Secondary | ICD-10-CM

## 2021-12-21 DIAGNOSIS — O26892 Other specified pregnancy related conditions, second trimester: Secondary | ICD-10-CM

## 2021-12-21 NOTE — Progress Notes (Signed)
   PRENATAL VISIT NOTE  Subjective:  Brianna Acosta is a 31 y.o. G2P1001 at [redacted]w[redacted]d being seen today for ongoing prenatal care.  She is currently monitored for the following issues for this high-risk pregnancy and has Supervision of other normal pregnancy, antepartum; History of cesarean delivery; History of placenta abruption; Rubella non-immune status, antepartum; Seasonal allergies; Abnormal chromosomal and genetic finding on antenatal screening of mother; Mosaic Turner syndrome of the patient; Rh negative status during pregnancy; Abnormal head movements; and Sciatica, unspecified side on their problem list.  Patient reports no complaints.  Contractions: Not present. Vag. Bleeding: None.  Movement: Present. Denies leaking of fluid.   The following portions of the patient's history were reviewed and updated as appropriate: allergies, current medications, past family history, past medical history, past social history, past surgical history and problem list.   Objective:   Vitals:   12/21/21 0937  BP: 124/84  Pulse: 91  Weight: 173 lb (78.5 kg)    Fetal Status: Fetal Heart Rate (bpm): 147   Movement: Present     General:  Alert, oriented and cooperative. Patient is in no acute distress.  Skin: Skin is warm and dry. No rash noted.   Cardiovascular: Normal heart rate noted  Respiratory: Normal respiratory effort, no problems with respiration noted  Abdomen: Soft, gravid, appropriate for gestational age.  Pain/Pressure: Absent     Pelvic: Cervical exam deferred        Extremities: Normal range of motion.  Edema: None  Mental Status: Normal mood and affect. Normal behavior. Normal judgment and thought content.   Assessment and Plan:  Pregnancy: G2P1001 at [redacted]w[redacted]d 1. Rh negative status during pregnancy in second trimester Rho gam next visit  2. Mosaic Turner syndrome of the patient Followed by MFM   3.  Sciatica and LBP Continue PT  4.  His cesarean section Pt may want VBAC; She  understands water birth is not an option with VBAC  Preterm labor symptoms and general obstetric precautions including but not limited to vaginal bleeding, contractions, leaking of fluid and fetal movement were reviewed in detail with the patient. Please refer to After Visit Summary for other counseling recommendations.    Future Appointments  Date Time Provider Department Center  12/21/2021  9:50 AM Lesly Dukes, MD [facility]  12/29/2021 11:00 AM Mikey Kirschner B, PT [facility] None  01/05/2022 11:45 AM Mikey Kirschner B, PT [facility] None  01/12/2022 11:00 AM Mikey Kirschner B, PT [facility] None  01/18/2022  8:30 AM Lesly Dukes, MD [facility]  01/19/2022 11:00 AM Lavinia Sharps C, PT [facility] None  01/26/2022 11:00 AM Mikey Kirschner B, PT [facility] None  01/29/2022  9:50 AM Donette Larry, CNM [facility]  02/01/2022  1:15 PM [facility] NURSE [facility]  02/01/2022  1:30 PM [facility] US2 [facility]  02/12/2022  8:30 AM Donette Larry, CNM [facility]    Elsie Lincoln, MD

## 2021-12-29 ENCOUNTER — Ambulatory Visit: Payer: BC Managed Care – PPO

## 2022-01-05 ENCOUNTER — Ambulatory Visit: Payer: BC Managed Care – PPO | Attending: Obstetrics and Gynecology

## 2022-01-05 DIAGNOSIS — R252 Cramp and spasm: Secondary | ICD-10-CM

## 2022-01-05 DIAGNOSIS — M5430 Sciatica, unspecified side: Secondary | ICD-10-CM

## 2022-01-05 DIAGNOSIS — M6281 Muscle weakness (generalized): Secondary | ICD-10-CM | POA: Diagnosis not present

## 2022-01-05 DIAGNOSIS — R262 Difficulty in walking, not elsewhere classified: Secondary | ICD-10-CM

## 2022-01-05 NOTE — Therapy (Signed)
OUTPATIENT PHYSICAL THERAPY DISCHARGE NOTE   Patient Name: Brianna Acosta MRN: [id number] DOB:03-Dec-1990, 31 y.o., female Today's Date: 01/05/2022  PCP: Luetta Nutting, DO REFERRING PROVIDER: Radene Gunning, MD  END OF SESSION:   PT End of Session - 01/05/22 1111     Visit Number 5    Date for PT Re-Evaluation 01/21/22    Authorization Type [employer] COMM PPO    PT Start Time 1110    Activity Tolerance Patient tolerated treatment well    Behavior During Therapy [facility] for tasks assessed/performed             Past Medical History:  Diagnosis Date   Anemia    Mosaic Turner syndrome    Placenta previa    Past Surgical History:  Procedure Laterality Date   APPENDECTOMY     CESAREAN SECTION     Patient Active Problem List   Diagnosis Date Noted   Abnormal head movements 11/25/2021   Sciatica, unspecified side 11/25/2021   Rh negative status during pregnancy 11/19/2021   Mosaic Turner syndrome of the patient 11/10/2021   Abnormal chromosomal and genetic finding on antenatal screening of mother 09/29/2021   Seasonal allergies 09/15/2021   Rubella non-immune status, antepartum 08/30/2021   History of cesarean delivery 08/25/2021   History of placenta abruption 08/25/2021   Supervision of other normal pregnancy, antepartum 08/03/2021    REFERRING DIAG: Sciatica  THERAPY DIAG:  Sciatica, unspecified laterality  Muscle weakness (generalized)  Difficulty in walking, not elsewhere classified  Cramp and spasm  Rationale for Evaluation and Treatment Rehabilitation  PERTINENT HISTORY: See above  PRECAUTIONS: pregnancy  SUBJECTIVE: Patient states she is doing good.  Has f/u appt on Monday with MD.  She would like for today to be her last day.   PAIN:  Are you having pain? Yes: NPRS scale: 0-1/10 Pain location: Right low back, glut Pain description: dull ache Aggravating factors: standing Relieving factors: rest, supine   OBJECTIVE: (objective  measures completed at initial evaluation unless otherwise dated)  OBJECTIVE:    DIAGNOSTIC FINDINGS:  none   PATIENT SURVEYS:  FOTO 53 goal is 49 DC ( 01/05/22 : 72)    SCREENING FOR RED FLAGS: Bowel or bladder incontinence: No Spinal tumors: No Cauda equina syndrome: No Compression fracture: No Abdominal aneurysm: No   COGNITION:           Overall cognitive status: Within functional limits for tasks assessed                          SENSATION: WFL   MUSCLE LENGTH: Hamstrings: Right WFL deg; Left WFL deg Thomas test: Right MILD LIMITATION deg; Left MILD LIMITATION deg   POSTURE: rounded shoulders     LUMBAR ROM:    Active  A/PROM  eval  Flexion WNL  Extension WNL  Right lateral flexion WNL  Left lateral flexion  WNL  Right rotation WNL  Left rotation WNL   (Blank rows = not tested)   LOWER EXTREMITY ROM:      All WNL   LOWER EXTREMITY MMT:     Generally 4+ to 5/5 with exception of right hip abduction 4-/5 and right hip ER 4-/5   LUMBAR SPECIAL TESTS:  Straight leg raise test: Negative     GAIT: Distance walked: 50 Assistive device utilized: None Level of assistance: Complete Independence Comments: Normal non antalgic gait       TODAY'S TREATMENT 01/05/22 DC assessment  completed (patient arrived at wrong time) Reviewed HEP  TODAY'S TREATMENT 12/15/21 NuStep x 5 min Supine hamstring stretch x 3 hold 30 sec Supine IT band stretch 2 x 30 sec Supine Piriformis stretch 2 x 30 sec Side lying clam x 20 each side Blue loop Hooklying clam x 20 with blue loop PPT x 20 PPT with marching heel taps x 20 PPT with SLR and shoulder flexion with physio ball x 20 each side PPT with dying bug with physio ball Quadruped cat camel x 10 Quadruped alternating arm and leg x 20 Quadruped fire hydrant x 10 each side   TODAY'S TREATMENT 12/08/21 NuStep x 5 min Standing hamstring stretch x 3 hold 30 sec Bilateral hip flexor/quad stretch x 2 hold 30 sec Supine  butterfly stretch 3 x 30 sec Hooklying clam x 20 with blue loop Side lying clam x 20 each side Blue loop Lateral band walks x 10 steps each way for 3 laps with blue loop PPT x 20 PPT with marching heel taps x 20 PPT with SLR and shoulder flexion with plyo ball (blue) x 20 each side PPT with Plyo ball pass 2 x 10  Quadruped cat camel x 10 Quadruped alternating arm and leg x 20 Quadruped fire hydrant x 10 each side   TODAY'S TREATMENT  Initial eval completed and initiated HEP     PATIENT EDUCATION:  Education details: Initiated HEP Person educated: Patient Education method: Consulting civil engineer, Media planner, Verbal cues, and Handouts Education comprehension: verbalized understanding, returned demonstration, and verbal cues required     HOME EXERCISE PROGRAM: Access Code: [email or web address] URL: [email or web address] Date: 01/05/2022 Prepared by: Candyce Churn  Exercises - Standing Hamstring Stretch on Chair  - 1 x daily - 7 x weekly - 1 sets - 2-3 reps - 20 sec hold - Standing Quad Stretch with Table and Chair Support  - 1 x daily - 7 x weekly - 1 sets - 2-3 reps - 20 hold - Small Range Straight Leg Raise  - 1 x daily - 7 x weekly - 2 sets - 10 reps - Sidelying Hip Abduction  - 1 x daily - 7 x weekly - 2 sets - 10 reps - Clamshell with Resistance  - 1 x daily - 7 x weekly - 2 sets - 10 reps - Hooklying Clamshell with Resistance  - 1 x daily - 7 x weekly - 2 sets - 10 reps - DNS Bug Heel Touches  - 1 x daily - 7 x weekly - 3 sets - 10 reps - Supine Dead Bug with Leg Extension  - 1 x daily - 7 x weekly - 3 sets - 10 reps - Bird Dog  - 1 x daily - 7 x weekly - 3 sets - 10 repsAccess Code: [email or web address] URL: [email or web address] Date: 11/26/2021 Prepared by: Candyce Churn   Exercises - Standing Hamstring Stretch on Chair  - 1 x daily - 7 x weekly - 1 sets - 2-3 reps - 20 sec hold - Standing Quad Stretch with Table and Chair Support  - 1 x daily - 7 x weekly - 1 sets  - 2-3 reps - 20 hold - Small Range Straight Leg Raise  - 1 x daily - 7 x weekly - 2 sets - 10 reps - Sidelying Hip Abduction  - 1 x daily - 7 x weekly - 2 sets - 10 reps - Clamshell with Resistance  - 1 x daily - 7 x weekly - 2 sets - 10 reps -  Hooklying Clamshell with Resistance  - 1 x daily - 7 x weekly - 2 sets - 10 reps   ASSESSMENT:   CLINICAL IMPRESSION: Jamara requests to be Dc'd today.  She is doing well per her report and all objective findings are improved.  She has met all goals and is well motivated and compliant.  She should continue to do well if she continues her HEP.  We will DC at this time.       OBJECTIVE IMPAIRMENTS decreased mobility, difficulty walking, decreased strength, increased muscle spasms, and pain.    ACTIVITY LIMITATIONS carrying, lifting, bending, standing, squatting, sleeping, transfers, and dressing   PARTICIPATION LIMITATIONS: meal prep, cleaning, laundry, shopping, community activity, occupation, and yard work   PERSONAL FACTORS 1 comorbidity: currently pregnant  are also affecting patient's functional outcome.    REHAB POTENTIAL: Good   CLINICAL DECISION MAKING: Stable/uncomplicated   EVALUATION COMPLEXITY: Low     GOALS: Goals reviewed with patient? Yes   SHORT TERM GOALS: Target date: 12/24/2021     Patient will be independent with initial HEP  Baseline: Goal status: MET 12/15/21   2.  Pain report to be no greater than 4/10  Baseline:  Goal status: MET 01/05/22     LONG TERM GOALS: Target date: 01/21/2022   Patient to be independent with advanced HEP  Baseline:  Goal status: Met 01/05/22   2.  Patient to report pain no greater than 2/10  Baseline:  Goal status: Met 01/05/22   3.  Patient to be able to tolerate standing on single leg to don pants Baseline:  Goal status:Met 01/05/22   4.  Patient to be able to stand for at least 15 min without exacerbation of low back pain Baseline:  Goal status: Met 01/05/22         PLAN: PT  FREQUENCY: 1-2x/week   PT DURATION: 8 weeks   PLANNED INTERVENTIONS: Therapeutic exercises, Therapeutic activity, Neuromuscular re-education, Balance training, Gait training, Patient/Family education, Joint mobilization, Stair training, Aquatic Therapy, Dry Needling, Spinal mobilization, Cryotherapy, Moist heat, Taping, Manual therapy, and Re-evaluation.   PLAN FOR NEXT SESSION: Review HEP and assess for effectiveness, NuStep, add in appropriate core and right hip strengthening, ice to right hip and buttock if needed.   PHYSICAL THERAPY DISCHARGE SUMMARY  Visits from Start of Care: 5  Current functional level related to goals / functional outcomes: See above   Remaining deficits: See above   Education / Equipment: See above   Patient agrees to discharge. Patient goals were met. Patient is being discharged due to meeting the stated rehab goals.    Anderson Malta B. Raegen Tarpley, PT 01/05/22 11:46 AM  [facility] [address] [place], [place] [address] Phone # [phone] Fax [phone]

## 2022-01-12 ENCOUNTER — Ambulatory Visit: Payer: BC Managed Care – PPO

## 2022-01-18 ENCOUNTER — Ambulatory Visit (INDEPENDENT_AMBULATORY_CARE_PROVIDER_SITE_OTHER): Payer: BC Managed Care – PPO | Admitting: Obstetrics & Gynecology

## 2022-01-18 VITALS — BP 105/73 | HR 91 | Wt 182.0 lb

## 2022-01-18 DIAGNOSIS — Z3480 Encounter for supervision of other normal pregnancy, unspecified trimester: Secondary | ICD-10-CM | POA: Diagnosis not present

## 2022-01-18 DIAGNOSIS — O360930 Maternal care for other rhesus isoimmunization, third trimester, not applicable or unspecified: Secondary | ICD-10-CM | POA: Diagnosis not present

## 2022-01-18 DIAGNOSIS — Z3A29 29 weeks gestation of pregnancy: Secondary | ICD-10-CM | POA: Diagnosis not present

## 2022-01-18 DIAGNOSIS — Z23 Encounter for immunization: Secondary | ICD-10-CM

## 2022-01-18 DIAGNOSIS — O99891 Other specified diseases and conditions complicating pregnancy: Secondary | ICD-10-CM | POA: Insufficient documentation

## 2022-01-18 DIAGNOSIS — M549 Dorsalgia, unspecified: Secondary | ICD-10-CM

## 2022-01-18 DIAGNOSIS — Q963 Mosaicism, 45, X/46, XX or XY: Secondary | ICD-10-CM

## 2022-01-18 MED ORDER — RHO D IMMUNE GLOBULIN 1500 UNIT/2ML IJ SOSY
300.0000 ug | PREFILLED_SYRINGE | Freq: Once | INTRAMUSCULAR | Status: AC
  Administered 2022-01-18: 300 ug via INTRAMUSCULAR

## 2022-01-18 NOTE — Progress Notes (Signed)
   PRENATAL VISIT NOTE  Subjective:  Ariela Mochizuki is a 31 y.o. G2P1001 at [redacted]w[redacted]d being seen today for ongoing prenatal care.  She is currently monitored for the following issues for this low-risk pregnancy and has Supervision of other normal pregnancy, antepartum; History of cesarean delivery; History of placenta abruption; Rubella non-immune status, antepartum; Seasonal allergies; Abnormal chromosomal and genetic finding on antenatal screening of mother; Mosaic Turner syndrome of the patient; Rh negative status during pregnancy; Abnormal head movements; Sciatica, unspecified side; and Back pain affecting pregnancy on their problem list.  Patient reports  bilateral back pain, lower; was in PT for it and got better with acute exacerbation on Saturday.  No dysuria, chills, fever .  Contractions: Not present. Vag. Bleeding: None.  Movement: Present. Denies leaking of fluid.   The following portions of the patient's history were reviewed and updated as appropriate: allergies, current medications, past family history, past medical history, past social history, past surgical history and problem list.   Objective:   Vitals:   01/18/22 0828  BP: 105/73  Pulse: 91  Weight: 182 lb (82.6 kg)    Fetal Status: Fetal Heart Rate (bpm): 140   Movement: Present     General:  Alert, oriented and cooperative. Patient is in no acute distress.  Skin: Skin is warm and dry. No rash noted.   Cardiovascular: Normal heart rate noted  Respiratory: Normal respiratory effort, no problems with respiration noted  Abdomen: Soft, gravid, appropriate for gestational age.  Pain/Pressure: Present     Pelvic: Cervical exam deferred        Extremities: Normal range of motion.  Edema: None  Mental Status: Normal mood and affect. Normal behavior. Normal judgment and thought content.   Assessment and Plan:  Pregnancy: G2P1001 at [redacted]w[redacted]d 1. Supervision of other normal pregnancy, antepartum - 2Hr GTT w/ 1 Hr Carpenter 75 g - HIV  antibody (with reflex) - CBC - RPR - rho (d) immune globulin (RHIG/RHOPHYLAC) injection 300 mcg - Antibody screen - Culture, OB Urine  2. Mosaic Turner syndrome of the patient Followed by MFM  3. Back pain in pregnancy - Ambulatory referral to Physical Therapy  4. Back pain affecting pregnancy, antepartum 1.  R/o pyelo 2.  Next visit with [facility] 3.  Ice, tylenol, magnesium; offered flexerill 4.  Referral to Dr. Karie Schwalbe if not better  5.  History of c/s Asked to be scheduled for October 10th; note sent to scheduler.  6.  Birthcontrol See OB Box  Preterm labor symptoms and general obstetric precautions including but not limited to vaginal bleeding, contractions, leaking of fluid and fetal movement were reviewed in detail with the patient. Please refer to After Visit Summary for other counseling recommendations.   Return in about 2 weeks (around 02/01/2022).  Future Appointments  Date Time Provider Department Center  01/29/2022  9:50 AM Donette Larry, CNM [facility]  02/01/2022  1:15 PM [facility] NURSE [facility]  02/01/2022  1:30 PM [facility] US2 [facility]  02/25/2022  1:10 PM Milas Hock, MD [facility]  03/12/2022  8:30 AM Armando Reichert, CNM [facility]  03/19/2022  8:30 AM Donette Larry, CNM [facility]    Elsie Lincoln, MD

## 2022-01-18 NOTE — Progress Notes (Signed)
Tdap and Rhogam given IM

## 2022-01-19 ENCOUNTER — Ambulatory Visit: Payer: BC Managed Care – PPO | Admitting: Physical Therapy

## 2022-01-19 LAB — CBC
HCT: 41 % (ref 35.0–45.0)
Hemoglobin: 13.5 g/dL (ref 11.7–15.5)
MCH: 28.7 pg (ref 27.0–33.0)
MCHC: 32.9 g/dL (ref 32.0–36.0)
MCV: 87.2 fL (ref 80.0–100.0)
MPV: 10.1 fL (ref 7.5–12.5)
Platelets: 303 10*3/uL (ref 140–400)
RBC: 4.7 10*6/uL (ref 3.80–5.10)
RDW: 12.4 % (ref 11.0–15.0)
WBC: 13.7 10*3/uL — ABNORMAL HIGH (ref 3.8–10.8)

## 2022-01-19 LAB — 2HR GTT W 1 HR, CARPENTER, 75 G
Glucose, 1 Hr, Gest: 153 mg/dL (ref 65–179)
Glucose, 2 Hr, Gest: 152 mg/dL (ref 65–152)
Glucose, Fasting, Gest: 75 mg/dL (ref 65–91)

## 2022-01-19 LAB — RPR: RPR Ser Ql: NONREACTIVE

## 2022-01-19 LAB — ANTIBODY SCREEN: Antibody Screen: NOT DETECTED

## 2022-01-19 LAB — HIV ANTIBODY (ROUTINE TESTING W REFLEX): HIV 1&2 Ab, 4th Generation: NONREACTIVE

## 2022-01-20 LAB — CULTURE, OB URINE

## 2022-01-20 LAB — URINE CULTURE, OB REFLEX

## 2022-01-26 ENCOUNTER — Ambulatory Visit: Payer: BC Managed Care – PPO | Attending: Obstetrics & Gynecology

## 2022-01-26 ENCOUNTER — Ambulatory Visit: Payer: BC Managed Care – PPO

## 2022-01-26 DIAGNOSIS — M549 Dorsalgia, unspecified: Secondary | ICD-10-CM | POA: Insufficient documentation

## 2022-01-26 DIAGNOSIS — M5430 Sciatica, unspecified side: Secondary | ICD-10-CM | POA: Diagnosis not present

## 2022-01-26 DIAGNOSIS — M6281 Muscle weakness (generalized): Secondary | ICD-10-CM | POA: Insufficient documentation

## 2022-01-26 DIAGNOSIS — R252 Cramp and spasm: Secondary | ICD-10-CM | POA: Diagnosis not present

## 2022-01-26 DIAGNOSIS — R250 Abnormal head movements: Secondary | ICD-10-CM | POA: Diagnosis not present

## 2022-01-26 DIAGNOSIS — R262 Difficulty in walking, not elsewhere classified: Secondary | ICD-10-CM | POA: Diagnosis not present

## 2022-01-26 DIAGNOSIS — O99891 Other specified diseases and conditions complicating pregnancy: Secondary | ICD-10-CM | POA: Insufficient documentation

## 2022-01-26 NOTE — Therapy (Signed)
OUTPATIENT PHYSICAL THERAPY RE-EVALUATION FOR NEW ORDER   Patient Name: Brianna Acosta MRN: [id number] DOB:1990/08/10, 31 y.o., female Today's Date: 01/26/2022  PCP: Everrett Coombe, DO REFERRING PROVIDER: Milas Hock, MD  END OF SESSION:   PT End of Session - 01/26/22 0900     Visit Number 6    Date for PT Re-Evaluation 03/23/22    Authorization Type [employer] COMM PPO    PT Start Time 0845    PT Stop Time 0930    PT Time Calculation (min) 45 min    Activity Tolerance Patient tolerated treatment well    Behavior During Therapy [facility] for tasks assessed/performed             Past Medical History:  Diagnosis Date   Anemia    Mosaic Turner syndrome    Placenta previa    Past Surgical History:  Procedure Laterality Date   APPENDECTOMY     CESAREAN SECTION     Patient Active Problem List   Diagnosis Date Noted   Back pain affecting pregnancy 01/18/2022   Abnormal head movements 11/25/2021   Sciatica, unspecified side 11/25/2021   Rh negative status during pregnancy 11/19/2021   Mosaic Turner syndrome of the patient 11/10/2021   Abnormal chromosomal and genetic finding on antenatal screening of mother 09/29/2021   Seasonal allergies 09/15/2021   Rubella non-immune status, antepartum 08/30/2021   History of cesarean delivery 08/25/2021   History of placenta abruption 08/25/2021   Supervision of other normal pregnancy, antepartum 08/03/2021    REFERRING DIAG: Sciatica  THERAPY DIAG:  Sciatica, unspecified laterality - Plan: PT plan of care cert/re-cert  Muscle weakness (generalized) - Plan: PT plan of care cert/re-cert  Difficulty in walking, not elsewhere classified - Plan: PT plan of care cert/re-cert  Cramp and spasm - Plan: PT plan of care cert/re-cert  Abnormal head movements - Plan: PT plan of care cert/re-cert  Rationale for Evaluation and Treatment Rehabilitation  PERTINENT HISTORY: See above  PRECAUTIONS: pregnancy  SUBJECTIVE:  Patient states she saw MD for f/u and pain had worsened again.  She was referred back to PT to continue treatment for her low back pain.  She rates her pain today at 5/10.    PAIN:  Are you having pain? Yes: NPRS scale: 5/10 Pain location: Right low back, glut Pain description: dull ache Aggravating factors: standing Relieving factors: rest, supine   OBJECTIVE: (objective measures completed at initial evaluation unless otherwise dated)  OBJECTIVE:    DIAGNOSTIC FINDINGS:  none   PATIENT SURVEYS:  FOTO 53 goal is 75 DC ( 01/05/22 : 72) Enter and administer next visit:    SCREENING FOR RED FLAGS: No changes at re-evaluation Bowel or bladder incontinence: No Spinal tumors: No Cauda equina syndrome: No Compression fracture: No Abdominal aneurysm: No   COGNITION:           Overall cognitive status: Within functional limits for tasks assessed                          SENSATION: WFL (no changes at re-eval)   MUSCLE LENGTH: Hamstrings: Right WFL deg; Left WFL deg Thomas test: Right MILD LIMITATION deg; Left MILD LIMITATION deg (No limitations at re-eval)   POSTURE: rounded shoulders     LUMBAR ROM:    Active  A/PROM  eval  Flexion WNL  Extension WNL  Right lateral flexion WNL  Left lateral flexion  WNL  Right rotation WNL  Left rotation WNL   (Blank rows = not tested)   LOWER EXTREMITY ROM:      All WNL   LOWER EXTREMITY MMT:     Generally 4+ to 5/5 with exception of right hip abduction 4-/5 and right hip ER 4-/5  Re-eval: 01/26/22 generally 4+/5 throughout including right hip abduction and ER but with pain on resistance   LUMBAR SPECIAL TESTS:  Straight leg raise test: Negative (no changes at re-eval)     GAIT: Distance walked: 50 Assistive device utilized: None Level of assistance: Complete Independence Comments: Normal non antalgic gait       TODAY'S TREATMENT 01/26/22 Re-assessment completed for new order Reviewed HEP Nustep x 5 min Supine  hamstring stretch 3 x 20 sec Supine ITB/lateral hip and glut stretch 3 x 20 sec Hip IR/ER combined stretching x 5 hold 10 sec each Piriformis stretch 3 x 20 sec each Educated patient on proper donning of Belly Band with assistance of [name], PT Ktape for pubic symphysis pressure relief   TODAY'S TREATMENT 01/05/22 DC assessment completed (patient arrived at wrong time) Reviewed HEP  TODAY'S TREATMENT 12/15/21 NuStep x 5 min Supine hamstring stretch x 3 hold 30 sec Supine IT band stretch 2 x 30 sec Supine Piriformis stretch 2 x 30 sec Side lying clam x 20 each side Blue loop Hooklying clam x 20 with blue loop PPT x 20 PPT with marching heel taps x 20 PPT with SLR and shoulder flexion with physio ball x 20 each side PPT with dying bug with physio ball Quadruped cat camel x 10 Quadruped alternating arm and leg x 20 Quadruped fire hydrant x 10 each side       PATIENT EDUCATION:  Education details: Initiated HEP Person educated: Patient Education method: Programmer, multimedia, Facilities manager, Verbal cues, and Handouts Education comprehension: verbalized understanding, returned demonstration, and verbal cues required     HOME EXERCISE PROGRAM: Access Code: [email or web address] URL: [email or web address] Date: 01/05/2022 Prepared by: Mikey Kirschner  Exercises - Standing Hamstring Stretch on Chair  - 1 x daily - 7 x weekly - 1 sets - 2-3 reps - 20 sec hold - Standing Quad Stretch with Table and Chair Support  - 1 x daily - 7 x weekly - 1 sets - 2-3 reps - 20 hold - Small Range Straight Leg Raise  - 1 x daily - 7 x weekly - 2 sets - 10 reps - Sidelying Hip Abduction  - 1 x daily - 7 x weekly - 2 sets - 10 reps - Clamshell with Resistance  - 1 x daily - 7 x weekly - 2 sets - 10 reps - Hooklying Clamshell with Resistance  - 1 x daily - 7 x weekly - 2 sets - 10 reps - DNS Bug Heel Touches  - 1 x daily - 7 x weekly - 3 sets - 10 reps - Supine Dead Bug with Leg Extension  - 1 x daily  - 7 x weekly - 3 sets - 10 reps - Bird Dog  - 1 x daily - 7 x weekly - 3 sets - 10 repsAccess Code: [email or web address] URL: [email or web address] Date: 11/26/2021 Prepared by: Mikey Kirschner   Exercises - Standing Hamstring Stretch on Chair  - 1 x daily - 7 x weekly - 1 sets - 2-3 reps - 20 sec hold - Standing Quad Stretch with Table and Chair Support  - 1 x daily - 7 x weekly - 1 sets - 2-3 reps - 20 hold - Small  Range Straight Leg Raise  - 1 x daily - 7 x weekly - 2 sets - 10 reps - Sidelying Hip Abduction  - 1 x daily - 7 x weekly - 2 sets - 10 reps - Clamshell with Resistance  - 1 x daily - 7 x weekly - 2 sets - 10 reps - Hooklying Clamshell with Resistance  - 1 x daily - 7 x weekly - 2 sets - 10 reps   ASSESSMENT:   CLINICAL IMPRESSION: Livi had recurrence of pain and obtained new order from MD.  She is having more localized low back pain mostly on left low back.  She is now 7 months pregnant.  Her C section is scheduled for 03/30/22 if she does not deliver prior to this.  She presents with no significant changes from DC assessment other than increased pain and normal progression of pregnancy.  She would benefit from resuming skilled PT to address S.I. joint pressure and lumbar spine pain.     OBJECTIVE IMPAIRMENTS decreased mobility, difficulty walking, decreased strength, increased muscle spasms, and pain.    ACTIVITY LIMITATIONS carrying, lifting, bending, standing, squatting, sleeping, transfers, and dressing   PARTICIPATION LIMITATIONS: meal prep, cleaning, laundry, shopping, community activity, occupation, and yard work   PERSONAL FACTORS 1 comorbidity: currently pregnant  are also affecting patient's functional outcome.    REHAB POTENTIAL: Good   CLINICAL DECISION MAKING: Stable/uncomplicated   EVALUATION COMPLEXITY: Low     GOALS: Goals reviewed with patient? Yes   SHORT TERM GOALS: Target date: 02/23/22     Patient will be independent with initial HEP   Baseline: Goal status: Initial   2.  Pain report to be no greater than 4/10  Baseline:  Goal status: Initial     LONG TERM GOALS: Target date: 03/23/2022   Patient to be independent with advanced HEP  Baseline:  Goal status: Initial   2.  Patient to report pain no greater than 2/10  Baseline:  Goal status: Initial   3.  Patient to be able to tolerate standing on single leg to don pants Baseline:  Goal status:Initial    4.  Patient to be able to stand for at least 15 min without exacerbation of low back pain Baseline:  Goal status: Initial         PLAN: PT FREQUENCY: 1-2x/week   PT DURATION: 8 weeks   PLANNED INTERVENTIONS: Therapeutic exercises, Therapeutic activity, Neuromuscular re-education, Balance training, Gait training, Patient/Family education, Joint mobilization, Stair training, Aquatic Therapy, Dry Needling, Spinal mobilization, Cryotherapy, Moist heat, Taping, Manual therapy, and Re-evaluation.   PLAN FOR NEXT SESSION: Review HEP and assess for effectiveness, NuStep, add in appropriate core and right hip strengthening, ice to right hip and buttock if needed.    Victorino Dike B. Jerianne Anselmo, PT 01/26/22 6:38 PM  [facility] [address] [place], [place] [address] Phone # [phone] Fax [phone]

## 2022-01-29 ENCOUNTER — Ambulatory Visit (INDEPENDENT_AMBULATORY_CARE_PROVIDER_SITE_OTHER): Payer: BC Managed Care – PPO | Admitting: Certified Nurse Midwife

## 2022-01-29 VITALS — BP 121/80 | HR 87 | Wt 186.0 lb

## 2022-01-29 DIAGNOSIS — Z3480 Encounter for supervision of other normal pregnancy, unspecified trimester: Secondary | ICD-10-CM

## 2022-01-29 DIAGNOSIS — Z3A30 30 weeks gestation of pregnancy: Secondary | ICD-10-CM

## 2022-01-29 DIAGNOSIS — Z3483 Encounter for supervision of other normal pregnancy, third trimester: Secondary | ICD-10-CM

## 2022-01-29 DIAGNOSIS — Z98891 History of uterine scar from previous surgery: Secondary | ICD-10-CM

## 2022-01-29 NOTE — Progress Notes (Signed)
Subjective:  Brianna Acosta is a 31 y.o. G2P1001 at [redacted]w[redacted]d being seen today for ongoing prenatal care.  She is currently monitored for the following issues for this low-risk pregnancy and has Supervision of other normal pregnancy, antepartum; History of cesarean delivery; History of placenta abruption; Rubella non-immune status, antepartum; Seasonal allergies; Abnormal chromosomal and genetic finding on antenatal screening of mother; Mosaic Turner syndrome of the patient; Rh negative status during pregnancy; Abnormal head movements; Sciatica, unspecified side; and Back pain affecting pregnancy on their problem list.  Patient reports no complaints.  Contractions: Not present. Vag. Bleeding: None.  Movement: Present. Denies leaking of fluid.   The following portions of the patient's history were reviewed and updated as appropriate: allergies, current medications, past family history, past medical history, past social history, past surgical history and problem list. Problem list updated.  Objective:   Vitals:   01/29/22 0951  BP: 121/80  Pulse: 87  Weight: 186 lb (84.4 kg)    Fetal Status: Fetal Heart Rate (bpm): 147 Fundal Height: 30 cm Movement: Present  Presentation: Vertex  General:  Alert, oriented and cooperative. Patient is in no acute distress.  Skin: Skin is warm and dry. No rash noted.   Cardiovascular: Normal heart rate noted  Respiratory: Normal respiratory effort, no problems with respiration noted  Abdomen: Soft, gravid, appropriate for gestational age. Pain/Pressure: Present     Pelvic: Vag. Bleeding: None Vag D/C Character: Thin   Cervical exam deferred        Extremities: Normal range of motion.  Edema: Trace  Mental Status: Normal mood and affect. Normal behavior. Normal judgment and thought content.   Urinalysis:      Assessment and Plan:  Pregnancy: G2P1001 at [redacted]w[redacted]d  1. Supervision of other normal pregnancy, antepartum  2. History of cesarean delivery -has RCS  scheduled but considering TOLAC if she labors before -discussed r/b -she is planning more children -VBAC consent provided for reviewt  3. [redacted] weeks gestation of pregnancy   Preterm labor symptoms and general obstetric precautions including but not limited to vaginal bleeding, contractions, leaking of fluid and fetal movement were reviewed in detail with the patient. Please refer to After Visit Summary for other counseling recommendations.  Return in about 2 weeks (around 02/12/2022).   Donette Larry, CNM

## 2022-02-01 ENCOUNTER — Ambulatory Visit: Payer: BC Managed Care – PPO | Admitting: *Deleted

## 2022-02-01 ENCOUNTER — Ambulatory Visit: Payer: BC Managed Care – PPO | Attending: Obstetrics and Gynecology

## 2022-02-01 ENCOUNTER — Other Ambulatory Visit: Payer: Self-pay | Admitting: *Deleted

## 2022-02-01 ENCOUNTER — Encounter: Payer: Self-pay | Admitting: *Deleted

## 2022-02-01 VITALS — BP 118/63 | HR 102

## 2022-02-01 DIAGNOSIS — O34219 Maternal care for unspecified type scar from previous cesarean delivery: Secondary | ICD-10-CM | POA: Diagnosis not present

## 2022-02-01 DIAGNOSIS — O09899 Supervision of other high risk pregnancies, unspecified trimester: Secondary | ICD-10-CM | POA: Diagnosis not present

## 2022-02-01 DIAGNOSIS — O280 Abnormal hematological finding on antenatal screening of mother: Secondary | ICD-10-CM

## 2022-02-01 DIAGNOSIS — O09292 Supervision of pregnancy with other poor reproductive or obstetric history, second trimester: Secondary | ICD-10-CM

## 2022-02-01 DIAGNOSIS — Z2839 Other underimmunization status: Secondary | ICD-10-CM | POA: Insufficient documentation

## 2022-02-01 DIAGNOSIS — O3510X0 Maternal care for (suspected) chromosomal abnormality in fetus, unspecified, not applicable or unspecified: Secondary | ICD-10-CM | POA: Diagnosis not present

## 2022-02-01 DIAGNOSIS — Z8759 Personal history of other complications of pregnancy, childbirth and the puerperium: Secondary | ICD-10-CM | POA: Diagnosis not present

## 2022-02-01 DIAGNOSIS — Z3A31 31 weeks gestation of pregnancy: Secondary | ICD-10-CM

## 2022-02-01 DIAGNOSIS — O285 Abnormal chromosomal and genetic finding on antenatal screening of mother: Secondary | ICD-10-CM

## 2022-02-04 ENCOUNTER — Ambulatory Visit: Payer: BC Managed Care – PPO

## 2022-02-11 ENCOUNTER — Ambulatory Visit (INDEPENDENT_AMBULATORY_CARE_PROVIDER_SITE_OTHER): Payer: BC Managed Care – PPO | Admitting: Family Medicine

## 2022-02-11 VITALS — BP 100/62 | HR 93 | Wt 186.0 lb

## 2022-02-11 DIAGNOSIS — M9903 Segmental and somatic dysfunction of lumbar region: Secondary | ICD-10-CM | POA: Diagnosis not present

## 2022-02-11 DIAGNOSIS — M9904 Segmental and somatic dysfunction of sacral region: Secondary | ICD-10-CM

## 2022-02-11 DIAGNOSIS — M549 Dorsalgia, unspecified: Secondary | ICD-10-CM

## 2022-02-11 DIAGNOSIS — O09893 Supervision of other high risk pregnancies, third trimester: Secondary | ICD-10-CM

## 2022-02-11 DIAGNOSIS — Z98891 History of uterine scar from previous surgery: Secondary | ICD-10-CM

## 2022-02-11 DIAGNOSIS — Z2839 Other underimmunization status: Secondary | ICD-10-CM

## 2022-02-11 DIAGNOSIS — Z3A32 32 weeks gestation of pregnancy: Secondary | ICD-10-CM

## 2022-02-11 DIAGNOSIS — M9905 Segmental and somatic dysfunction of pelvic region: Secondary | ICD-10-CM | POA: Diagnosis not present

## 2022-02-11 DIAGNOSIS — O09899 Supervision of other high risk pregnancies, unspecified trimester: Secondary | ICD-10-CM

## 2022-02-11 DIAGNOSIS — O99891 Other specified diseases and conditions complicating pregnancy: Secondary | ICD-10-CM

## 2022-02-11 DIAGNOSIS — Z3480 Encounter for supervision of other normal pregnancy, unspecified trimester: Secondary | ICD-10-CM

## 2022-02-11 NOTE — Progress Notes (Signed)
   PRENATAL VISIT NOTE  Subjective:  Brianna Acosta is a 31 y.o. G2P1001 at [redacted]w[redacted]d being seen today for ongoing prenatal care.  She is currently monitored for the following issues for this low-risk pregnancy and has Supervision of other normal pregnancy, antepartum; History of cesarean delivery; History of placenta abruption; Rubella non-immune status, antepartum; Seasonal allergies; Abnormal chromosomal and genetic finding on antenatal screening of mother; Mosaic Turner syndrome of the patient; Rh negative status during pregnancy; Abnormal head movements; Sciatica, unspecified side; and Back pain affecting pregnancy on their problem list.  Patient reports  back pain in lower back. Went to PT and was improving. Graduated from PT last week. Had pain in back this weekend. Difficulty transitioning to standing from sitting. No radiation of pain .  Contractions: Not present. Vag. Bleeding: None.  Movement: Present. Denies leaking of fluid.   The following portions of the patient's history were reviewed and updated as appropriate: allergies, current medications, past family history, past medical history, past social history, past surgical history and problem list. Problem list updated.  Objective:   Vitals:   02/11/22 0824  BP: 100/62  Pulse: 93  Weight: 186 lb (84.4 kg)    Fetal Status: Fetal Heart Rate (bpm): 145   Movement: Present     General:  Alert, oriented and cooperative. Patient is in no acute distress.  Skin: Skin is warm and dry. No rash noted.   Cardiovascular: Normal heart rate noted  Respiratory: Normal respiratory effort, no problems with respiration noted  Abdomen: Soft, gravid, appropriate for gestational age. Pain/Pressure: Present     Pelvic:  Cervical exam deferred        MSK: Restriction, tenderness, tissue texture changes, and paraspinal spasm in the lumbar spine  Neuro: Moves all four extremities with no focal neurological deficit  Extremities: Normal range of motion.   Edema: Trace  Mental Status: Normal mood and affect. Normal behavior. Normal judgment and thought content.   OSE: Head   Cervical   Thoracic   Rib   Lumbar L1 ESRL, L5 ESRR  Sacrum L/L  Pelvis Right ant.    Assessment and Plan:  Pregnancy: G2P1001 at [redacted]w[redacted]d  1. [redacted] weeks gestation of pregnancy  2. Supervision of other normal pregnancy, antepartum FHT normal  3. Rubella non-immune status, antepartum MMR post delivery  4. History of cesarean delivery RLTCS  5. Back pain affecting pregnancy, antepartum 6. Somatic dysfunction of lumbar region 7. Somatic dysfunction of sacral region 8. Somatic dysfunction of pelvis region OMT done after patient permission. HVLA technique utilized. 3 areas treated with improvement of tissue texture and joint mobility. Patient tolerated procedure well.    Preterm labor symptoms and general obstetric precautions including but not limited to vaginal bleeding, contractions, leaking of fluid and fetal movement were reviewed in detail with the patient. Please refer to After Visit Summary for other counseling recommendations.  No follow-ups on file.  Truett Mainland, DO

## 2022-02-12 ENCOUNTER — Encounter: Payer: BC Managed Care – PPO | Admitting: Certified Nurse Midwife

## 2022-02-17 ENCOUNTER — Ambulatory Visit: Payer: BC Managed Care – PPO

## 2022-02-17 DIAGNOSIS — R262 Difficulty in walking, not elsewhere classified: Secondary | ICD-10-CM

## 2022-02-17 DIAGNOSIS — O99891 Other specified diseases and conditions complicating pregnancy: Secondary | ICD-10-CM | POA: Diagnosis not present

## 2022-02-17 DIAGNOSIS — M6281 Muscle weakness (generalized): Secondary | ICD-10-CM | POA: Diagnosis not present

## 2022-02-17 DIAGNOSIS — R252 Cramp and spasm: Secondary | ICD-10-CM | POA: Diagnosis not present

## 2022-02-17 DIAGNOSIS — M5430 Sciatica, unspecified side: Secondary | ICD-10-CM

## 2022-02-17 DIAGNOSIS — R250 Abnormal head movements: Secondary | ICD-10-CM | POA: Diagnosis not present

## 2022-02-17 DIAGNOSIS — M549 Dorsalgia, unspecified: Secondary | ICD-10-CM | POA: Diagnosis not present

## 2022-02-17 NOTE — Therapy (Signed)
OUTPATIENT PHYSICAL THERAPY RE-EVALUATION FOR NEW ORDER   Patient Name: Brianna Acosta MRN: [id number] DOB:03-29-1991, 31 y.o., female Today's Date: 02/17/2022  PCP: Everrett Coombe, DO REFERRING PROVIDER: Milas Hock, MD  END OF SESSION:   PT End of Session - 02/17/22 1234     Visit Number 7    Date for PT Re-Evaluation 03/23/22    Authorization Type [employer] COMM PPO    PT Start Time 1145    PT Stop Time 1230    PT Time Calculation (min) 45 min    Activity Tolerance Patient tolerated treatment well    Behavior During Therapy WFL for tasks assessed/performed              Past Medical History:  Diagnosis Date   Anemia    Mosaic Turner syndrome    Placenta previa    Past Surgical History:  Procedure Laterality Date   APPENDECTOMY     CESAREAN SECTION     Patient Active Problem List   Diagnosis Date Noted   Back pain affecting pregnancy 01/18/2022   Abnormal head movements 11/25/2021   Sciatica, unspecified side 11/25/2021   Rh negative status during pregnancy 11/19/2021   Mosaic Turner syndrome of the patient 11/10/2021   Abnormal chromosomal and genetic finding on antenatal screening of mother 09/29/2021   Seasonal allergies 09/15/2021   Rubella non-immune status, antepartum 08/30/2021   History of cesarean delivery 08/25/2021   History of placenta abruption 08/25/2021   Supervision of other normal pregnancy, antepartum 08/03/2021    REFERRING DIAG: Sciatica  THERAPY DIAG:  Sciatica, unspecified laterality  Muscle weakness (generalized)  Difficulty in walking, not elsewhere classified  Cramp and spasm  Rationale for Evaluation and Treatment Rehabilitation  PERTINENT HISTORY: See above  PRECAUTIONS: pregnancy  SUBJECTIVE: Patient states she did well with the taping but did have a reaction to the tape and had to remove it.  She is wearing her belly band, however, and this is helping.  She states she is having some "pins and needles"  in the last few days.  I went to a D.O. in the past few days, however, and he did an adjustment on me and that is when I remember feeling the pins and needles.  (About 3 days after)  PAIN:  Are you having pain? Yes: NPRS scale: 5/10 Pain location: Right low back, glut Pain description: dull ache Aggravating factors: standing Relieving factors: rest, supine   OBJECTIVE: (objective measures completed at initial evaluation unless otherwise dated)  OBJECTIVE:    DIAGNOSTIC FINDINGS:  none   PATIENT SURVEYS:  FOTO 53 goal is 11 DC ( 01/05/22 : 72) Enter and administer next visit:    SCREENING FOR RED FLAGS: No changes at re-evaluation Bowel or bladder incontinence: No Spinal tumors: No Cauda equina syndrome: No Compression fracture: No Abdominal aneurysm: No   COGNITION:           Overall cognitive status: Within functional limits for tasks assessed                          SENSATION: WFL (no changes at re-eval)   MUSCLE LENGTH: Hamstrings: Right WFL deg; Left WFL deg Thomas test: Right MILD LIMITATION deg; Left MILD LIMITATION deg (No limitations at re-eval)   POSTURE: rounded shoulders     LUMBAR ROM:    Active  A/PROM  eval  Flexion WNL  Extension WNL  Right lateral flexion WNL  Left  lateral flexion  WNL  Right rotation WNL  Left rotation WNL   (Blank rows = not tested)   LOWER EXTREMITY ROM:      All WNL   LOWER EXTREMITY MMT:     Generally 4+ to 5/5 with exception of right hip abduction 4-/5 and right hip ER 4-/5  Re-eval: 01/26/22 generally 4+/5 throughout including right hip abduction and ER but with pain on resistance   LUMBAR SPECIAL TESTS:  Straight leg raise test: Negative (no changes at re-eval)     GAIT: Distance walked: 50 Assistive device utilized: None Level of assistance: Complete Independence Comments: Normal non antalgic gait     TODAY'S TREATMENT 02/17/22 Nustep x 5 min Hooklying lower trunk rotation x 10 Isometric hip adduction  with ball x 10 hold 5 sec Isometic hip abduction with belt x 10 hold 5 sec each Supine hamstring stretch 3 x 30 sec Supine ITB/lateral hip and glut stretch 3 x 30 sec Hip IR/ER combined stretching x 5 hold 10 sec each  TODAY'S TREATMENT 01/26/22 Re-assessment completed for new order Reviewed HEP Nustep x 5 min Supine hamstring stretch 3 x 20 sec Supine ITB/lateral hip and glut stretch 3 x 20 sec Hip IR/ER combined stretching x 5 hold 10 sec each Piriformis stretch 3 x 20 sec each Educated patient on proper donning of Belly Band with assistance of [name], PT Ktape for pubic symphysis pressure relief   TODAY'S TREATMENT 01/05/22 DC assessment completed (patient arrived at wrong time) Reviewed HEP    PATIENT EDUCATION:  Education details: Initiated HEP Person educated: Patient Education method: Programmer, multimedia, Facilities manager, Verbal cues, and Handouts Education comprehension: verbalized understanding, returned demonstration, and verbal cues required     HOME EXERCISE PROGRAM: Access Code: [email or web address] URL: [email or web address] Date: 01/05/2022 Prepared by: Mikey Kirschner  Exercises - Standing Hamstring Stretch on Chair  - 1 x daily - 7 x weekly - 1 sets - 2-3 reps - 20 sec hold - Standing Quad Stretch with Table and Chair Support  - 1 x daily - 7 x weekly - 1 sets - 2-3 reps - 20 hold - Small Range Straight Leg Raise  - 1 x daily - 7 x weekly - 2 sets - 10 reps - Sidelying Hip Abduction  - 1 x daily - 7 x weekly - 2 sets - 10 reps - Clamshell with Resistance  - 1 x daily - 7 x weekly - 2 sets - 10 reps - Hooklying Clamshell with Resistance  - 1 x daily - 7 x weekly - 2 sets - 10 reps - DNS Bug Heel Touches  - 1 x daily - 7 x weekly - 3 sets - 10 reps - Supine Dead Bug with Leg Extension  - 1 x daily - 7 x weekly - 3 sets - 10 reps - Bird Dog  - 1 x daily - 7 x weekly - 3 sets - 10 repsAccess Code: [email or web address] URL: [email or web address] Date:  11/26/2021 Prepared by: Mikey Kirschner   Exercises - Standing Hamstring Stretch on Chair  - 1 x daily - 7 x weekly - 1 sets - 2-3 reps - 20 sec hold - Standing Quad Stretch with Table and Chair Support  - 1 x daily - 7 x weekly - 1 sets - 2-3 reps - 20 hold - Small Range Straight Leg Raise  - 1 x daily - 7 x weekly - 2 sets - 10 reps - Sidelying Hip Abduction  - 1 x daily - 7  x weekly - 2 sets - 10 reps - Clamshell with Resistance  - 1 x daily - 7 x weekly - 2 sets - 10 reps - Hooklying Clamshell with Resistance  - 1 x daily - 7 x weekly - 2 sets - 10 reps   ASSESSMENT:   CLINICAL IMPRESSION: Andreea responded well to taping and stretches from last session.  She is having some new radicular symptoms since being adjusted by D.O.  Her C section is scheduled for 03/30/22 if she does not deliver prior to this.  She responded well to today's treatment.   She would benefit from resuming skilled PT to address S.I. joint pressure and lumbar spine pain.     OBJECTIVE IMPAIRMENTS decreased mobility, difficulty walking, decreased strength, increased muscle spasms, and pain.    ACTIVITY LIMITATIONS carrying, lifting, bending, standing, squatting, sleeping, transfers, and dressing   PARTICIPATION LIMITATIONS: meal prep, cleaning, laundry, shopping, community activity, occupation, and yard work   PERSONAL FACTORS 1 comorbidity: currently pregnant  are also affecting patient's functional outcome.    REHAB POTENTIAL: Good   CLINICAL DECISION MAKING: Stable/uncomplicated   EVALUATION COMPLEXITY: Low     GOALS: Goals reviewed with patient? Yes   SHORT TERM GOALS: Target date: 02/23/22     Patient will be independent with initial HEP  Baseline: Goal status: Initial   2.  Pain report to be no greater than 4/10  Baseline:  Goal status: Initial     LONG TERM GOALS: Target date: 03/23/2022   Patient to be independent with advanced HEP  Baseline:  Goal status: Initial   2.  Patient to report  pain no greater than 2/10  Baseline:  Goal status: Initial   3.  Patient to be able to tolerate standing on single leg to don pants Baseline:  Goal status:Initial    4.  Patient to be able to stand for at least 15 min without exacerbation of low back pain Baseline:  Goal status: Initial         PLAN: PT FREQUENCY: 1-2x/week   PT DURATION: 8 weeks   PLANNED INTERVENTIONS: Therapeutic exercises, Therapeutic activity, Neuromuscular re-education, Balance training, Gait training, Patient/Family education, Joint mobilization, Stair training, Aquatic Therapy, Dry Needling, Spinal mobilization, Cryotherapy, Moist heat, Taping, Manual therapy, and Re-evaluation.   PLAN FOR NEXT SESSION: Review HEP and assess for effectiveness, NuStep, add in appropriate core and right hip strengthening, ice to right hip and buttock if needed.    Victorino Dike B. Tyauna Lacaze, PT 02/17/22 1:20 PM  [facility] [address], [address] [place], [place] [address] Phone # [phone] Fax [phone]

## 2022-02-24 ENCOUNTER — Ambulatory Visit: Payer: BC Managed Care – PPO | Attending: Obstetrics & Gynecology

## 2022-02-24 DIAGNOSIS — R252 Cramp and spasm: Secondary | ICD-10-CM

## 2022-02-24 DIAGNOSIS — M5430 Sciatica, unspecified side: Secondary | ICD-10-CM

## 2022-02-24 DIAGNOSIS — R262 Difficulty in walking, not elsewhere classified: Secondary | ICD-10-CM

## 2022-02-24 DIAGNOSIS — M6281 Muscle weakness (generalized): Secondary | ICD-10-CM

## 2022-02-24 NOTE — Therapy (Signed)
OUTPATIENT PHYSICAL THERAPY RE-EVALUATION FOR NEW ORDER   Patient Name: Brianna Acosta MRN: [id number] DOB:Sep 11, 1990, 31 y.o., female Today's Date: 02/24/2022  PCP: Everrett Coombe, DO REFERRING PROVIDER: Milas Hock, MD  END OF SESSION:   PT End of Session - 02/24/22 1204     Visit Number 8    Date for PT Re-Evaluation 03/23/22    Authorization Type [employer] COMM PPO    PT Start Time 1155    PT Stop Time 1230    PT Time Calculation (min) 35 min    Activity Tolerance Patient tolerated treatment well    Behavior During Therapy WFL for tasks assessed/performed              Past Medical History:  Diagnosis Date   Anemia    Mosaic Turner syndrome    Placenta previa    Past Surgical History:  Procedure Laterality Date   APPENDECTOMY     CESAREAN SECTION     Patient Active Problem List   Diagnosis Date Noted   Back pain affecting pregnancy 01/18/2022   Abnormal head movements 11/25/2021   Sciatica, unspecified side 11/25/2021   Rh negative status during pregnancy 11/19/2021   Mosaic Turner syndrome of the patient 11/10/2021   Abnormal chromosomal and genetic finding on antenatal screening of mother 09/29/2021   Seasonal allergies 09/15/2021   Rubella non-immune status, antepartum 08/30/2021   History of cesarean delivery 08/25/2021   History of placenta abruption 08/25/2021   Supervision of other normal pregnancy, antepartum 08/03/2021    REFERRING DIAG: Sciatica  THERAPY DIAG:  Sciatica, unspecified laterality  Muscle weakness (generalized)  Difficulty in walking, not elsewhere classified  Cramp and spasm  Rationale for Evaluation and Treatment Rehabilitation  PERTINENT HISTORY: See above  PRECAUTIONS: pregnancy  SUBJECTIVE: Patient states she is feeling, "Eh"  States she is still feeling the pins and needles mostly in right LE (foot).    PAIN:  Are you having pain? Yes: NPRS scale: 5/10 Pain location: Right low back, glut Pain  description: dull ache Aggravating factors: standing Relieving factors: rest, supine   OBJECTIVE: (objective measures completed at initial evaluation unless otherwise dated)  OBJECTIVE:    DIAGNOSTIC FINDINGS:  none   PATIENT SURVEYS:  FOTO 53 goal is 58 DC ( 01/05/22 : 72) Enter and administer next visit:    SCREENING FOR RED FLAGS: No changes at re-evaluation Bowel or bladder incontinence: No Spinal tumors: No Cauda equina syndrome: No Compression fracture: No Abdominal aneurysm: No   COGNITION:           Overall cognitive status: Within functional limits for tasks assessed                          SENSATION: WFL (no changes at re-eval)   MUSCLE LENGTH: Hamstrings: Right WFL deg; Left WFL deg Thomas test: Right MILD LIMITATION deg; Left MILD LIMITATION deg (No limitations at re-eval)   POSTURE: rounded shoulders     LUMBAR ROM:    Active  A/PROM  eval  Flexion WNL  Extension WNL  Right lateral flexion WNL  Left lateral flexion  WNL  Right rotation WNL  Left rotation WNL   (Blank rows = not tested)   LOWER EXTREMITY ROM:      All WNL   LOWER EXTREMITY MMT:     Generally 4+ to 5/5 with exception of right hip abduction 4-/5 and right hip ER 4-/5  Re-eval: 01/26/22 generally  4+/5 throughout including right hip abduction and ER but with pain on resistance   LUMBAR SPECIAL TESTS:  Straight leg raise test: Negative (no changes at re-eval)     GAIT: Distance walked: 50 Assistive device utilized: None Level of assistance: Complete Independence Comments: Normal non antalgic gait     TODAY'S TREATMENT 02/24/22 Nustep x 5 min Supine sciatic nerve flossing x 30 Supine piriformis stretch 3 x 30 Hooklying clam with yellow loop x 20 Sidelying clam with yellow loop x 20 each side Child's pose x 2 (patient did not feel much of a stretch) switched to cat/camel x 10 Quadruped fire hydrants 2 x 10 each  Quadruped alternate arm and leg x 20 Standing adductor  stretch in lunge position x 5 each side holding 10 sec each  TODAY'S TREATMENT 02/17/22 Nustep x 5 min Hooklying lower trunk rotation x 10 Isometric hip adduction with ball x 10 hold 5 sec Isometic hip abduction with belt x 10 hold 5 sec each Supine hamstring stretch 3 x 30 sec Supine ITB/lateral hip and glut stretch 3 x 30 sec Hip IR/ER combined stretching x 5 hold 10 sec each  TODAY'S TREATMENT 01/26/22 Re-assessment completed for new order Reviewed HEP Nustep x 5 min Supine hamstring stretch 3 x 20 sec Supine ITB/lateral hip and glut stretch 3 x 20 sec Hip IR/ER combined stretching x 5 hold 10 sec each Piriformis stretch 3 x 20 sec each Educated patient on proper donning of Belly Band with assistance of [name], PT [place] for pubic symphysis pressure relief   TODAY'S TREATMENT 01/05/22 DC assessment completed (patient arrived at wrong time) Reviewed HEP    PATIENT EDUCATION:  Education details: Initiated HEP Person educated: Patient Education method: Programmer, multimedia, Facilities manager, Verbal cues, and Handouts Education comprehension: verbalized understanding, returned demonstration, and verbal cues required     HOME EXERCISE PROGRAM: Access Code: [email or web address] URL: [email or web address] Date: 01/05/2022 Prepared by: Mikey Kirschner  Exercises - Standing Hamstring Stretch on Chair  - 1 x daily - 7 x weekly - 1 sets - 2-3 reps - 20 sec hold - Standing Quad Stretch with Table and Chair Support  - 1 x daily - 7 x weekly - 1 sets - 2-3 reps - 20 hold - Small Range Straight Leg Raise  - 1 x daily - 7 x weekly - 2 sets - 10 reps - Sidelying Hip Abduction  - 1 x daily - 7 x weekly - 2 sets - 10 reps - Clamshell with Resistance  - 1 x daily - 7 x weekly - 2 sets - 10 reps - Hooklying Clamshell with Resistance  - 1 x daily - 7 x weekly - 2 sets - 10 reps - DNS Bug Heel Touches  - 1 x daily - 7 x weekly - 3 sets - 10 reps - Supine Dead Bug with Leg Extension  - 1 x daily -  7 x weekly - 3 sets - 10 reps - Bird Dog  - 1 x daily - 7 x weekly - 3 sets - 10 repsAccess Code: [email or web address] URL: [email or web address] Date: 11/26/2021 Prepared by: Mikey Kirschner   Exercises - Standing Hamstring Stretch on Chair  - 1 x daily - 7 x weekly - 1 sets - 2-3 reps - 20 sec hold - Standing Quad Stretch with Table and Chair Support  - 1 x daily - 7 x weekly - 1 sets - 2-3 reps - 20 hold - Small Range Straight Leg Raise  - 1 x  daily - 7 x weekly - 2 sets - 10 reps - Sidelying Hip Abduction  - 1 x daily - 7 x weekly - 2 sets - 10 reps - Clamshell with Resistance  - 1 x daily - 7 x weekly - 2 sets - 10 reps - Hooklying Clamshell with Resistance  - 1 x daily - 7 x weekly - 2 sets - 10 reps   ASSESSMENT:   CLINICAL IMPRESSION: Natily continues to have some radicular type symptoms but these are no worse.  She understands that once she delivers, this will likely improve.  She responded well to todays treatment and reported decreased symptoms at end of session.  She is compliant and well motivated.  She should continue to do well.    She would benefit from resuming skilled PT to address S.I. joint pressure and lumbar spine pain.     OBJECTIVE IMPAIRMENTS decreased mobility, difficulty walking, decreased strength, increased muscle spasms, and pain.    ACTIVITY LIMITATIONS carrying, lifting, bending, standing, squatting, sleeping, transfers, and dressing   PARTICIPATION LIMITATIONS: meal prep, cleaning, laundry, shopping, community activity, occupation, and yard work   PERSONAL FACTORS 1 comorbidity: currently pregnant  are also affecting patient's functional outcome.    REHAB POTENTIAL: Good   CLINICAL DECISION MAKING: Stable/uncomplicated   EVALUATION COMPLEXITY: Low     GOALS: Goals reviewed with patient? Yes   SHORT TERM GOALS: Target date: 02/23/22     Patient will be independent with initial HEP  Baseline: Goal status: Initial   2.  Pain report to be no  greater than 4/10  Baseline:  Goal status: Initial     LONG TERM GOALS: Target date: 03/23/2022   Patient to be independent with advanced HEP  Baseline:  Goal status: Initial   2.  Patient to report pain no greater than 2/10  Baseline:  Goal status: Initial   3.  Patient to be able to tolerate standing on single leg to don pants Baseline:  Goal status:Initial    4.  Patient to be able to stand for at least 15 min without exacerbation of low back pain Baseline:  Goal status: Initial         PLAN: PT FREQUENCY: 1-2x/week   PT DURATION: 8 weeks   PLANNED INTERVENTIONS: Therapeutic exercises, Therapeutic activity, Neuromuscular re-education, Balance training, Gait training, Patient/Family education, Joint mobilization, Stair training, Aquatic Therapy, Dry Needling, Spinal mobilization, Cryotherapy, Moist heat, Taping, Manual therapy, and Re-evaluation.   PLAN FOR NEXT SESSION:  NuStep, add in appropriate core and right hip strengthening, patient responds well to quadruped position, ice to right hip and buttock if needed.    Anderson Malta B. Markeith Jue, PT 02/24/22 3:16 PM  [facility] [address] [place] [address] [place], [place] [address] Phone # [phone] Fax [phone]

## 2022-02-25 ENCOUNTER — Encounter: Payer: BC Managed Care – PPO | Admitting: Obstetrics and Gynecology

## 2022-02-25 ENCOUNTER — Ambulatory Visit (INDEPENDENT_AMBULATORY_CARE_PROVIDER_SITE_OTHER): Payer: BC Managed Care – PPO | Admitting: Family Medicine

## 2022-02-25 VITALS — BP 113/70 | HR 89 | Wt 190.0 lb

## 2022-02-25 DIAGNOSIS — M549 Dorsalgia, unspecified: Secondary | ICD-10-CM

## 2022-02-25 DIAGNOSIS — M9905 Segmental and somatic dysfunction of pelvic region: Secondary | ICD-10-CM | POA: Diagnosis not present

## 2022-02-25 DIAGNOSIS — M9904 Segmental and somatic dysfunction of sacral region: Secondary | ICD-10-CM | POA: Diagnosis not present

## 2022-02-25 DIAGNOSIS — Z6791 Unspecified blood type, Rh negative: Secondary | ICD-10-CM

## 2022-02-25 DIAGNOSIS — Z98891 History of uterine scar from previous surgery: Secondary | ICD-10-CM

## 2022-02-25 DIAGNOSIS — M9903 Segmental and somatic dysfunction of lumbar region: Secondary | ICD-10-CM

## 2022-02-25 DIAGNOSIS — Z3483 Encounter for supervision of other normal pregnancy, third trimester: Secondary | ICD-10-CM

## 2022-02-25 DIAGNOSIS — Z3A34 34 weeks gestation of pregnancy: Secondary | ICD-10-CM

## 2022-02-25 DIAGNOSIS — Q963 Mosaicism, 45, X/46, XX or XY: Secondary | ICD-10-CM | POA: Diagnosis not present

## 2022-02-25 DIAGNOSIS — O26893 Other specified pregnancy related conditions, third trimester: Secondary | ICD-10-CM

## 2022-02-25 DIAGNOSIS — O99891 Other specified diseases and conditions complicating pregnancy: Secondary | ICD-10-CM | POA: Diagnosis not present

## 2022-02-25 DIAGNOSIS — Z3480 Encounter for supervision of other normal pregnancy, unspecified trimester: Secondary | ICD-10-CM

## 2022-02-25 NOTE — Progress Notes (Signed)
   PRENATAL VISIT NOTE  Subjective:  Brianna Acosta is a 31 y.o. G2P1001 at [redacted]w[redacted]d being seen today for ongoing prenatal care.  She is currently monitored for the following issues for this high-risk pregnancy and has Supervision of other normal pregnancy, antepartum; History of cesarean delivery; History of placenta abruption; Rubella non-immune status, antepartum; Seasonal allergies; Abnormal chromosomal and genetic finding on antenatal screening of mother; Mosaic Turner syndrome of the patient; Rh negative status during pregnancy; Abnormal head movements; Sciatica, unspecified side; and Back pain affecting pregnancy on their problem list.  Patient reports  back pain improved. Was bilateral, now mainly on right. Some parasthesias in right arm, wakes her up at night .  Contractions: Not present. Vag. Bleeding: None.  Movement: Present. Denies leaking of fluid.   The following portions of the patient's history were reviewed and updated as appropriate: allergies, current medications, past family history, past medical history, past social history, past surgical history and problem list.   Objective:   Vitals:   02/25/22 1327  BP: 113/70  Pulse: 89  Weight: 190 lb (86.2 kg)    Fetal Status: Fetal Heart Rate (bpm): 139   Movement: Present     General:  Alert, oriented and cooperative. Patient is in no acute distress.  Skin: Skin is warm and dry. No rash noted.   Cardiovascular: Normal heart rate noted  Respiratory: Normal respiratory effort, no problems with respiration noted  Abdomen: Soft, gravid, appropriate for gestational age.  Pain/Pressure: Present     Pelvic: Cervical exam deferred        Extremities: Normal range of motion.  Edema: Trace  Mental Status: Normal mood and affect. Normal behavior. Normal judgment and thought content.   MSK: Restriction, tenderness, tissue texture changes, and paraspinal spasm in the right lumbar spine  Neuro: Moves all four extremities with no focal  neurological deficit   OSE: Head   Cervical   Thoracic   Rib   Lumbar L5 ESRR  Sacrum L/L  Pelvis Right ant innom    Assessment and Plan:  Pregnancy: G2P1001 at [redacted]w[redacted]d 1. [redacted] weeks gestation of pregnancy  2. Supervision of other normal pregnancy, antepartum FHT and FH normal  3. Mosaic Turner syndrome of the patient Has appt with peds genetics at [facility] Growth [place] normal  4. Rh negative status during pregnancy in third trimester  5. History of cesarean delivery  6. Back pain affecting pregnancy, antepartum 7. Somatic dysfunction of sacral region 8. Somatic dysfunction of pelvis region 9. Somatic dysfunction of lumbar region OMT done after patient permission. HVLA technique utilized. 3 areas treated with improvement of tissue texture and joint mobility. Patient tolerated procedure well.     Preterm labor symptoms and general obstetric precautions including but not limited to vaginal bleeding, contractions, leaking of fluid and fetal movement were reviewed in detail with the patient. Please refer to After Visit Summary for other counseling recommendations.   No follow-ups on file.  Future Appointments  Date Time Provider Department Center  03/03/2022 11:45 AM Mikey Kirschner B, PT [facility] None  03/08/2022 12:30 PM [facility] NURSE [facility]  03/08/2022 12:45 PM [facility] US4 [facility]  03/10/2022 11:45 AM Mikey Kirschner B, PT [facility] None  03/12/2022  8:30 AM Armando Reichert, CNM [facility]  03/17/2022 11:45 AM Mikey Kirschner B, PT [facility] None  03/18/2022  9:35 AM Levie Heritage, DO [facility] None  03/19/2022  8:30 AM Donette Larry, CNM [facility]    Levie Heritage, DO

## 2022-02-26 DIAGNOSIS — Z7183 Encounter for nonprocreative genetic counseling: Secondary | ICD-10-CM | POA: Diagnosis not present

## 2022-03-03 ENCOUNTER — Ambulatory Visit: Payer: BC Managed Care – PPO

## 2022-03-03 DIAGNOSIS — R252 Cramp and spasm: Secondary | ICD-10-CM | POA: Diagnosis not present

## 2022-03-03 DIAGNOSIS — R262 Difficulty in walking, not elsewhere classified: Secondary | ICD-10-CM

## 2022-03-03 DIAGNOSIS — M5430 Sciatica, unspecified side: Secondary | ICD-10-CM | POA: Diagnosis not present

## 2022-03-03 DIAGNOSIS — M6281 Muscle weakness (generalized): Secondary | ICD-10-CM | POA: Diagnosis not present

## 2022-03-03 NOTE — Therapy (Signed)
OUTPATIENT PHYSICAL THERAPY DISCHARGE VISIT   Patient Name: Brianna Acosta MRN: [id number] DOB:October 13, 1990, 31 y.o., female Today's Date: 03/03/2022  PCP: Luetta Nutting, DO REFERRING PROVIDER: Radene Gunning, MD  END OF SESSION:   PT End of Session - 03/03/22 1159     Visit Number 9    Date for PT Re-Evaluation 03/23/22    Authorization Type [employer] BC[employer] COMM PPO    PT Start Time 1153    PT Stop Time 1230    PT Time Calculation (min) 37 min    Activity Tolerance Patient tolerated treatment well    Behavior During Therapy WFL for tasks assessed/performed              Past Medical History:  Diagnosis Date   Anemia    Mosaic Turner syndrome    Placenta previa    Past Surgical History:  Procedure Laterality Date   APPENDECTOMY     CESAREAN SECTION     Patient Active Problem List   Diagnosis Date Noted   Back pain affecting pregnancy 01/18/2022   Abnormal head movements 11/25/2021   Sciatica, unspecified side 11/25/2021   Rh negative status during pregnancy 11/19/2021   Mosaic Turner syndrome of the patient 11/10/2021   Abnormal chromosomal and genetic finding on antenatal screening of mother 09/29/2021   Seasonal allergies 09/15/2021   Rubella non-immune status, antepartum 08/30/2021   History of cesarean delivery 08/25/2021   History of placenta abruption 08/25/2021   Supervision of other normal pregnancy, antepartum 08/03/2021    REFERRING DIAG: Sciatica  THERAPY DIAG:  Sciatica, unspecified laterality  Muscle weakness (generalized)  Difficulty in walking, not elsewhere classified  Cramp and spasm  Rationale for Evaluation and Treatment Rehabilitation  PERTINENT HISTORY: See above  PRECAUTIONS: pregnancy  SUBJECTIVE: Patient states she will go ahead and DC today.  She is feeling as stable as expected and knows that once she delivers most of the pain will diminish.  She feels she can continue her HEP independently and manage until  delivery.    PAIN:  Are you having pain? Yes: NPRS scale: 4-5/10 Pain location: Right low back, glut Pain description: dull ache Aggravating factors: standing Relieving factors: rest, supine   OBJECTIVE: (objective measures completed at initial evaluation unless otherwise dated)  OBJECTIVE:    DIAGNOSTIC FINDINGS:  none   PATIENT SURVEYS:  FOTO 53 goal is 42 DC ( 01/05/22 : 72) 2nd DC (03/03/22)   SCREENING FOR RED FLAGS: No changes at re-evaluation Bowel or bladder incontinence: No Spinal tumors: No Cauda equina syndrome: No Compression fracture: No Abdominal aneurysm: No   COGNITION:           Overall cognitive status: Within functional limits for tasks assessed                          SENSATION: WFL (no changes at re-eval)   MUSCLE LENGTH: Hamstrings: Right WFL deg; Left WFL deg Thomas test: Right MILD LIMITATION deg; Left MILD LIMITATION deg (No limitations at re-eval)   POSTURE: rounded shoulders     LUMBAR ROM:    Active  A/PROM  eval  Flexion WNL  Extension WNL  Right lateral flexion WNL  Left lateral flexion  WNL  Right rotation WNL  Left rotation WNL   (Blank rows = not tested)   LOWER EXTREMITY ROM:      All WNL   LOWER EXTREMITY MMT:     Generally 4+ to 5/5  with exception of right hip abduction 4-/5 and right hip ER 4-/5  Re-eval: 01/26/22 generally 4+/5 throughout including right hip abduction and ER but with pain on resistance   LUMBAR SPECIAL TESTS:  Straight leg raise test: Negative (no changes at re-eval)     GAIT: Distance walked: 50 Assistive device utilized: None Level of assistance: Complete Independence Comments: Normal non antalgic gait     TODAY'S TREATMENT 03/03/22 NuStep x 5 min PT present to discuss plan Updated and reviewed HEP:  Hamstring stretch 1 x 30 sec on each LE IT band stretch 1 x 30 sec on each LE Combo hip IR/ER stretch 1 x 30 sec each side Supine sciatic nerve flossing x 30 Hooklying clam with yellow  loop x 20 Sidelying clam with yellow loop x 20 each side Child's pose x 2 (patient did not feel much of a stretch) switched to cat/camel x 10 Isometric hip adduction  TODAY'S TREATMENT 02/24/22 Nustep x 5 min Supine sciatic nerve flossing x 30 Supine piriformis stretch 3 x 30 Hooklying clam with yellow loop x 20 Sidelying clam with yellow loop x 20 each side Child's pose x 2 (patient did not feel much of a stretch) switched to cat/camel x 10 Quadruped fire hydrants 2 x 10 each  Quadruped alternate arm and leg x 20 Standing adductor stretch in lunge position x 5 each side holding 10 sec each  TODAY'S TREATMENT 02/17/22 Nustep x 5 min Hooklying lower trunk rotation x 10 Isometric hip adduction with ball x 10 hold 5 sec Isometic hip abduction with belt x 10 hold 5 sec each Supine hamstring stretch 3 x 30 sec Supine ITB/lateral hip and glut stretch 3 x 30 sec Hip IR/ER combined stretching x 5 hold 10 sec each  TODAY'S TREATMENT 01/05/22 DC assessment completed (patient arrived at wrong time) Reviewed HEP    PATIENT EDUCATION:  Education details: Initiated HEP Person educated: Patient Education method: Consulting civil engineer, Media planner, Verbal cues, and Handouts Education comprehension: verbalized understanding, returned demonstration, and verbal cues required     HOME EXERCISE PROGRAM: Access Code: [email or web address] URL: [email or web address] Date: 01/05/2022 Prepared by: Candyce Churn  Exercises - Standing Hamstring Stretch on Chair  - 1 x daily - 7 x weekly - 1 sets - 2-3 reps - 20 sec hold - Standing Quad Stretch with Table and Chair Support  - 1 x daily - 7 x weekly - 1 sets - 2-3 reps - 20 hold - Small Range Straight Leg Raise  - 1 x daily - 7 x weekly - 2 sets - 10 reps - Sidelying Hip Abduction  - 1 x daily - 7 x weekly - 2 sets - 10 reps - Clamshell with Resistance  - 1 x daily - 7 x weekly - 2 sets - 10 reps - Hooklying Clamshell with Resistance  - 1 x daily - 7 x  weekly - 2 sets - 10 reps - DNS Bug Heel Touches  - 1 x daily - 7 x weekly - 3 sets - 10 reps - Supine Dead Bug with Leg Extension  - 1 x daily - 7 x weekly - 3 sets - 10 reps - Bird Dog  - 1 x daily - 7 x weekly - 3 sets - 10 repsAccess Code: [email or web address] URL: [email or web address] Date: 11/26/2021 Prepared by: Candyce Churn   Exercises - Standing Hamstring Stretch on Chair  - 1 x daily - 7 x weekly - 1 sets - 2-3 reps - 20 sec hold - Standing  Sports administrator with Table and Chair Support  - 1 x daily - 7 x weekly - 1 sets - 2-3 reps - 20 hold - Small Range Straight Leg Raise  - 1 x daily - 7 x weekly - 2 sets - 10 reps - Sidelying Hip Abduction  - 1 x daily - 7 x weekly - 2 sets - 10 reps - Clamshell with Resistance  - 1 x daily - 7 x weekly - 2 sets - 10 reps - Hooklying Clamshell with Resistance  - 1 x daily - 7 x weekly - 2 sets - 10 reps   ASSESSMENT:   CLINICAL IMPRESSION: Estellar continues to have some radicular type symptoms but these are no worse.  She understands that once she delivers, this will likely improve.  She is pleased with her current level of function and is independent with her HEP.  We updated these today and educated on proper progression.  She should continue to do well.       OBJECTIVE IMPAIRMENTS decreased mobility, difficulty walking, decreased strength, increased muscle spasms, and pain.    ACTIVITY LIMITATIONS carrying, lifting, bending, standing, squatting, sleeping, transfers, and dressing   PARTICIPATION LIMITATIONS: meal prep, cleaning, laundry, shopping, community activity, occupation, and yard work   PERSONAL FACTORS 1 comorbidity: currently pregnant  are also affecting patient's functional outcome.    REHAB POTENTIAL: Good   CLINICAL DECISION MAKING: Stable/uncomplicated   EVALUATION COMPLEXITY: Low     GOALS: Goals reviewed with patient? Yes   SHORT TERM GOALS: Target date: 02/23/22     Patient will be independent with initial HEP   Baseline: Goal status: MET   2.  Pain report to be no greater than 4/10  Baseline:  Goal status: MET     LONG TERM GOALS: Target date: 03/23/2022   Patient to be independent with advanced HEP  Baseline:  Goal status: MET   2.  Patient to report pain no greater than 2/10  Baseline:  Goal status: Partially met   3.  Patient to be able to tolerate standing on single leg to don pants Baseline:  Goal status: Partially met   4.  Patient to be able to stand for at least 15 min without exacerbation of low back pain Baseline:  Goal status: Partially met         PLAN: PT FREQUENCY: 1-2x/week   PT DURATION: 8 weeks   PLANNED INTERVENTIONS: Therapeutic exercises, Therapeutic activity, Neuromuscular re-education, Balance training, Gait training, Patient/Family education, Joint mobilization, Stair training, Aquatic Therapy, Dry Needling, Spinal mobilization, Cryotherapy, Moist heat, Taping, Manual therapy, and Re-evaluation.   PLAN FOR NEXT SESSION:  We will DC at this time per patient request.    PHYSICAL THERAPY DISCHARGE SUMMARY  Visits from Start of Care: 9  Current functional level related to goals / functional outcomes: See above   Remaining deficits: See above   Education / Equipment: See above   Patient agrees to discharge. Patient goals were partially met. Patient is being discharged due to being pleased with the current functional level.     Anderson Malta B. Veva Grimley, PT 03/03/22 2:00 PM  [facility] [address] [place], [place] [address] Phone # [phone] Fax [phone]

## 2022-03-08 ENCOUNTER — Ambulatory Visit: Payer: BC Managed Care – PPO | Attending: Obstetrics

## 2022-03-08 ENCOUNTER — Encounter: Payer: Self-pay | Admitting: *Deleted

## 2022-03-08 ENCOUNTER — Ambulatory Visit: Payer: BC Managed Care – PPO | Admitting: *Deleted

## 2022-03-08 VITALS — BP 115/74 | HR 91

## 2022-03-08 DIAGNOSIS — Z3A36 36 weeks gestation of pregnancy: Secondary | ICD-10-CM | POA: Diagnosis not present

## 2022-03-08 DIAGNOSIS — O09293 Supervision of pregnancy with other poor reproductive or obstetric history, third trimester: Secondary | ICD-10-CM

## 2022-03-08 DIAGNOSIS — O3515X0 Maternal care for (suspected) chromosomal abnormality in fetus, sex chromosome abnormality, not applicable or unspecified: Secondary | ICD-10-CM | POA: Diagnosis not present

## 2022-03-08 DIAGNOSIS — O09899 Supervision of other high risk pregnancies, unspecified trimester: Secondary | ICD-10-CM | POA: Diagnosis not present

## 2022-03-08 DIAGNOSIS — Z2839 Other underimmunization status: Secondary | ICD-10-CM | POA: Diagnosis not present

## 2022-03-08 DIAGNOSIS — O280 Abnormal hematological finding on antenatal screening of mother: Secondary | ICD-10-CM | POA: Diagnosis not present

## 2022-03-08 DIAGNOSIS — O34219 Maternal care for unspecified type scar from previous cesarean delivery: Secondary | ICD-10-CM

## 2022-03-09 ENCOUNTER — Encounter: Payer: Self-pay | Admitting: Family Medicine

## 2022-03-09 ENCOUNTER — Encounter: Payer: Self-pay | Admitting: Obstetrics & Gynecology

## 2022-03-09 ENCOUNTER — Ambulatory Visit (INDEPENDENT_AMBULATORY_CARE_PROVIDER_SITE_OTHER): Payer: BC Managed Care – PPO | Admitting: Family Medicine

## 2022-03-09 VITALS — BP 117/72 | HR 95 | Wt 190.0 lb

## 2022-03-09 DIAGNOSIS — J029 Acute pharyngitis, unspecified: Secondary | ICD-10-CM

## 2022-03-09 LAB — POCT RAPID STREP A (OFFICE): Rapid Strep A Screen: NEGATIVE

## 2022-03-09 NOTE — Progress Notes (Signed)
   Acute Office Visit  Subjective:     Patient ID: Brianna Acosta, female    DOB: 1991-06-13, 31 y.o.   MRN: [id number]  Chief Complaint  Patient presents with   Sore Throat    HPI Patient is in today for sore throat that started today.  No fever chills sweats nasal congestion ear pain etc.  She says that her toddler tested positive for strep over the weekend and just wanted to make sure that she was not getting it.  ROS      Objective:    BP 117/72   Pulse 95   Wt 190 lb (86.2 kg)   LMP 06/27/2021 (Exact Date)   SpO2 99%   BMI 32.44 kg/m    Physical Exam Vitals reviewed.  Constitutional:      Appearance: She is well-developed.  HENT:     Head: Normocephalic and atraumatic.     Right Ear: External ear normal.     Left Ear: External ear normal.     Nose: Nose normal.  Eyes:     Conjunctiva/sclera: Conjunctivae normal.     Pupils: Pupils are equal, round, and reactive to light.  Neck:     Thyroid: No thyromegaly.  Cardiovascular:     Rate and Rhythm: Normal rate and regular rhythm.     Heart sounds: Normal heart sounds.  Pulmonary:     Effort: Pulmonary effort is normal.     Breath sounds: Normal breath sounds. No wheezing.  Musculoskeletal:     Cervical back: Neck supple.  Lymphadenopathy:     Cervical: No cervical adenopathy.  Skin:    General: Skin is warm and dry.     Coloration: Skin is not pale.  Neurological:     Mental Status: She is alert and oriented to person, place, and time.  Psychiatric:        Behavior: Behavior normal.     Results for orders placed or performed in visit on 03/09/22  POCT rapid strep A  Result Value Ref Range   Rapid Strep A Screen Negative Negative        Assessment & Plan:   Problem List Items Addressed This Visit   None Visit Diagnoses     Pharyngitis, unspecified etiology    -  Primary   Sore throat       Relevant Orders   POCT rapid strep A (Completed)       Pharyngitis-negative for strep throat.  She  did have a positive exposure if it gets worse the next couple days please let us know especially if develops fever.  No orders of the defined types were placed in this encounter.   No follow-ups on file.  Beatrice Lecher, MD

## 2022-03-12 ENCOUNTER — Ambulatory Visit (INDEPENDENT_AMBULATORY_CARE_PROVIDER_SITE_OTHER): Payer: BC Managed Care – PPO | Admitting: Advanced Practice Midwife

## 2022-03-12 ENCOUNTER — Other Ambulatory Visit (HOSPITAL_COMMUNITY)
Admission: RE | Admit: 2022-03-12 | Discharge: 2022-03-12 | Disposition: A | Discharge status: Home / Self Care | Payer: BC Managed Care – PPO | Source: Ambulatory Visit | Attending: Advanced Practice Midwife | Admitting: Advanced Practice Midwife

## 2022-03-12 VITALS — BP 121/81 | HR 105 | Wt 187.0 lb

## 2022-03-12 DIAGNOSIS — Z23 Encounter for immunization: Secondary | ICD-10-CM | POA: Diagnosis not present

## 2022-03-12 DIAGNOSIS — Z3480 Encounter for supervision of other normal pregnancy, unspecified trimester: Secondary | ICD-10-CM | POA: Diagnosis not present

## 2022-03-12 DIAGNOSIS — Q963 Mosaicism, 45, X/46, XX or XY: Secondary | ICD-10-CM

## 2022-03-12 DIAGNOSIS — Z3A36 36 weeks gestation of pregnancy: Secondary | ICD-10-CM

## 2022-03-12 NOTE — Progress Notes (Signed)
   PRENATAL VISIT NOTE  Subjective:  Brianna Acosta is a 31 y.o. G2P1001 at [redacted]w[redacted]d being seen today for ongoing prenatal care.  She is currently monitored for the following issues for this low-risk pregnancy and has Supervision of other normal pregnancy, antepartum; History of cesarean delivery; History of placenta abruption; Rubella non-immune status, antepartum; Seasonal allergies; Abnormal chromosomal and genetic finding on antenatal screening of mother; Mosaic Turner syndrome of the patient; Rh negative status during pregnancy; Abnormal head movements; Sciatica, unspecified side; and Back pain affecting pregnancy on their problem list.  Patient reports occasional contractions.  Contractions: Not present. Vag. Bleeding: None.  Movement: Present. Denies leaking of fluid.   The following portions of the patient's history were reviewed and updated as appropriate: allergies, current medications, past family history, past medical history, past social history, past surgical history and problem list.   Objective:   Vitals:   03/12/22 0824  BP: 121/81  Pulse: (!) 105  Weight: 187 lb (84.8 kg)    Fetal Status: Fetal Heart Rate (bpm): 145 Fundal Height: 37 cm Movement: Present     General:  Alert, oriented and cooperative. Patient is in no acute distress.  Skin: Skin is warm and dry. No rash noted.   Cardiovascular: Normal heart rate noted  Respiratory: Normal respiratory effort, no problems with respiration noted  Abdomen: Soft, gravid, appropriate for gestational age.  Pain/Pressure: Absent     Pelvic: Cervical exam performed in the presence of a chaperone Dilation: Fingertip Effacement (%): Thick Station: Ballotable  Extremities: Normal range of motion.  Edema: Trace  Mental Status: Normal mood and affect. Normal behavior. Normal judgment and thought content.   Assessment and Plan:  Pregnancy: G2P1001 at 109w6d 1. Supervision of other normal pregnancy, antepartum - Cervicovaginal ancillary  only( [place]) - Culture, beta strep (group b only)  2. [redacted] weeks gestation of pregnancy - routine care  3. Needs flu shot - Flu Vaccine QUAD 41mo+IM (Fluarix, Fluzone & Alfiuria Quad PF)  Term labor symptoms and general obstetric precautions including but not limited to vaginal bleeding, contractions, leaking of fluid and fetal movement were reviewed in detail with the patient. Please refer to After Visit Summary for other counseling recommendations.   Return in about 1 week (around 03/19/2022).  Future Appointments  Date Time Provider [place]  03/18/2022  9:35 AM Truett Mainland, DO [facility] None  03/19/2022  8:30 AM Julianne Handler, CNM [facility]  03/24/2022  2:30 PM Gala Romney Fredderick Phenix, MD [facility]    Marcille Buffy DNP, CNM  03/12/22  12:04 PM

## 2022-03-12 NOTE — Patient Instructions (Addendum)
Cardiologist [phone]   Safe Medications in Pregnancy   Acne: Benzoyl Peroxide Salicylic Acid  Backache/Headache: Tylenol: 2 regular strength every 4 hours OR              2 Extra strength every 6 hours  Colds/Coughs/Allergies: Benadryl (alcohol free) 25 mg every 6 hours as needed Breath right strips Claritin Cepacol throat lozenges Chloraseptic throat spray Cold-Eeze- up to three times per day Cough drops, alcohol free Flonase (by prescription only) Guaifenesin Mucinex Robitussin DM (plain only, alcohol free) Saline nasal spray/drops Sudafed (pseudoephedrine) & Actifed ** use only after [redacted] weeks gestation and if you do not have high blood pressure Tylenol Vicks Vaporub Zinc lozenges Zyrtec   Constipation: Colace Ducolax suppositories Fleet enema Glycerin suppositories Metamucil Milk of magnesia Miralax Senokot Smooth move tea  Diarrhea: Kaopectate Imodium A-D  *NO pepto Bismol  Hemorrhoids: Anusol Anusol HC Preparation H Tucks  Indigestion: Tums Maalox Mylanta Zantac  Pepcid  Insomnia: Benadryl (alcohol free) 25mg  every 6 hours as needed Tylenol PM Unisom, no Gelcaps  Leg Cramps: Tums MagGel  Nausea/Vomiting:  Bonine Dramamine Emetrol Ginger extract Sea bands Meclizine  Nausea medication to take during pregnancy:  Unisom (doxylamine succinate 25 mg tablets) Take one tablet daily at bedtime. If symptoms are not adequately controlled, the dose can be increased to a maximum recommended dose of two tablets daily (1/2 tablet in the morning, 1/2 tablet mid-afternoon and one at bedtime). Vitamin B6 100mg  tablets. Take one tablet twice a day (up to 200 mg per day).  Skin Rashes: Aveeno products Benadryl cream or 25mg  every 6 hours as needed Calamine Lotion 1% cortisone cream  Yeast infection: Gyne-lotrimin 7 Monistat 7   **If taking multiple medications, please check labels to avoid duplicating the same active  ingredients **take medication as directed on the label ** Do not exceed 4000 mg of tylenol in 24 hours **Do not take medications that contain aspirin or ibuprofen

## 2022-03-13 LAB — CERVICOVAGINAL ANCILLARY ONLY
Chlamydia: NEGATIVE
Comment: NEGATIVE
Comment: NORMAL
Neisseria Gonorrhea: NEGATIVE

## 2022-03-15 LAB — CULTURE, BETA STREP (GROUP B ONLY)
MICRO NUMBER:: 13955880
SPECIMEN QUALITY:: ADEQUATE

## 2022-03-16 NOTE — Patient Instructions (Signed)
Onnie Alatorre  03/16/2022   Your procedure is scheduled on:  03/30/2022  Arrive at [place] at Entrance C on [address] at [facility]  and [address]. You are invited to use the FREE valet parking or use the Visitor's parking deck.  Pick up the phone at the desk and dial [phone].  Call this number if you have problems the morning of surgery: [phone]  Remember:   Do not eat food:(After Midnight) Desps de medianoche.  Do not drink clear liquids: (After Midnight) Desps de medianoche.  Take these medicines the morning of surgery with A SIP OF WATER:  none   Do not wear jewelry, make-up or nail polish.  Do not wear lotions, powders, or perfumes. Do not wear deodorant.  Do not shave 48 hours prior to surgery.  Do not bring valuables to the hospital.  [facility] is not   responsible for any belongings or valuables brought to the hospital.  Contacts, dentures or bridgework may not be worn into surgery.  Leave suitcase in the car. After surgery it may be brought to your room.  For patients admitted to the hospital, checkout time is 11:00 AM the day of              discharge.      Please read over the following fact sheets that you were given:     Preparing for Surgery

## 2022-03-17 ENCOUNTER — Encounter (HOSPITAL_COMMUNITY): Payer: Self-pay

## 2022-03-17 ENCOUNTER — Telehealth (HOSPITAL_COMMUNITY): Payer: Self-pay | Admitting: *Deleted

## 2022-03-17 ENCOUNTER — Ambulatory Visit: Payer: BC Managed Care – PPO

## 2022-03-17 ENCOUNTER — Ambulatory Visit (INDEPENDENT_AMBULATORY_CARE_PROVIDER_SITE_OTHER): Payer: BC Managed Care – PPO | Admitting: Family Medicine

## 2022-03-17 VITALS — BP 107/70 | HR 91 | Wt 190.0 lb

## 2022-03-17 DIAGNOSIS — Z2839 Other underimmunization status: Secondary | ICD-10-CM

## 2022-03-17 DIAGNOSIS — Z98891 History of uterine scar from previous surgery: Secondary | ICD-10-CM

## 2022-03-17 DIAGNOSIS — M9903 Segmental and somatic dysfunction of lumbar region: Secondary | ICD-10-CM

## 2022-03-17 DIAGNOSIS — Z3480 Encounter for supervision of other normal pregnancy, unspecified trimester: Secondary | ICD-10-CM

## 2022-03-17 DIAGNOSIS — M9904 Segmental and somatic dysfunction of sacral region: Secondary | ICD-10-CM | POA: Diagnosis not present

## 2022-03-17 DIAGNOSIS — Q963 Mosaicism, 45, X/46, XX or XY: Secondary | ICD-10-CM

## 2022-03-17 DIAGNOSIS — Z3A37 37 weeks gestation of pregnancy: Secondary | ICD-10-CM

## 2022-03-17 DIAGNOSIS — Z6791 Unspecified blood type, Rh negative: Secondary | ICD-10-CM

## 2022-03-17 DIAGNOSIS — M549 Dorsalgia, unspecified: Secondary | ICD-10-CM

## 2022-03-17 DIAGNOSIS — O99891 Other specified diseases and conditions complicating pregnancy: Secondary | ICD-10-CM

## 2022-03-17 DIAGNOSIS — M9905 Segmental and somatic dysfunction of pelvic region: Secondary | ICD-10-CM

## 2022-03-17 DIAGNOSIS — O26893 Other specified pregnancy related conditions, third trimester: Secondary | ICD-10-CM

## 2022-03-17 DIAGNOSIS — O09893 Supervision of other high risk pregnancies, third trimester: Secondary | ICD-10-CM

## 2022-03-17 NOTE — Telephone Encounter (Signed)
Preadmission screen  

## 2022-03-17 NOTE — Progress Notes (Signed)
   PRENATAL VISIT NOTE  Subjective:  Brianna Acosta is a 31 y.o. G2P1001 at [redacted]w[redacted]d being seen today for ongoing prenatal care.  She is currently monitored for the following issues for this high-risk pregnancy and has Supervision of other normal pregnancy, antepartum; History of cesarean delivery; History of placenta abruption; Rubella non-immune status, antepartum; Seasonal allergies; Abnormal chromosomal and genetic finding on antenatal screening of mother; Mosaic Turner syndrome of the patient; Rh negative status during pregnancy; Abnormal head movements; Sciatica, unspecified side; and Back pain affecting pregnancy on their problem list.  Patient reports  back pain improving. Some pain occasionally with prolonged walking, but less pain with sitting to standing transitions .  Contractions: Not present. Vag. Bleeding: None.  Movement: Present. Denies leaking of fluid.   The following portions of the patient's history were reviewed and updated as appropriate: allergies, current medications, past family history, past medical history, past social history, past surgical history and problem list. Problem list updated.  Objective:   Vitals:   03/17/22 0827  BP: 107/70  Pulse: 91  Weight: 190 lb (86.2 kg)    Fetal Status: Fetal Heart Rate (bpm): 147   Movement: Present     General:  Alert, oriented and cooperative. Patient is in no acute distress.  Skin: Skin is warm and dry. No rash noted.   Cardiovascular: Normal heart rate noted  Respiratory: Normal respiratory effort, no problems with respiration noted  Abdomen: Soft, gravid, appropriate for gestational age. Pain/Pressure: Absent     Pelvic:  Cervical exam deferred        MSK: Restriction, tenderness, tissue texture changes in the lumbar spine  Neuro: Moves all four extremities with no focal neurological deficit  Extremities: Normal range of motion.  Edema: Trace  Mental Status: Normal mood and affect. Normal behavior. Normal judgment and  thought content.   OSE: Head   Cervical   Thoracic   Rib   Lumbar L1 ESRL, L5 ESRR  Sacrum L/L torsion  Pelvis Right ant innom    Assessment and Plan:  Pregnancy: G2P1001 at [redacted]w[redacted]d  1. Supervision of other normal pregnancy, antepartum FHT and FH normal Planning on iud for contraception  2. History of cesarean delivery Has rpt c/s at 39 weeks  3. Rh negative status during pregnancy in third trimester  4. Rubella non-immune status, antepartum  5. Mosaic Turner syndrome of the patient Growth 35%  6. Back pain affecting pregnancy, antepartum 7. Somatic dysfunction of pelvis region 8. Somatic dysfunction of lumbar region 9. Somatic dysfunction of sacral region OMT done after patient permission. HVLA technique utilized. 3 areas treated with improvement of tissue texture and joint mobility. Patient tolerated procedure well.    Term labor symptoms and general obstetric precautions including but not limited to vaginal bleeding, contractions, leaking of fluid and fetal movement were reviewed in detail with the patient. Please refer to After Visit Summary for other counseling recommendations.  No follow-ups on file.  Truett Mainland, DO

## 2022-03-18 ENCOUNTER — Encounter: Payer: BC Managed Care – PPO | Admitting: Family Medicine

## 2022-03-18 ENCOUNTER — Encounter (HOSPITAL_COMMUNITY): Payer: Self-pay

## 2022-03-19 ENCOUNTER — Encounter: Payer: BC Managed Care – PPO | Admitting: Certified Nurse Midwife

## 2022-03-23 ENCOUNTER — Encounter: Payer: Self-pay | Admitting: Cardiology

## 2022-03-23 ENCOUNTER — Ambulatory Visit: Payer: BC Managed Care – PPO | Attending: Cardiology | Admitting: Cardiology

## 2022-03-23 VITALS — BP 112/70 | HR 98 | Ht 64.0 in | Wt 190.2 lb

## 2022-03-23 DIAGNOSIS — Q963 Mosaicism, 45, X/46, XX or XY: Secondary | ICD-10-CM

## 2022-03-23 DIAGNOSIS — Z7689 Persons encountering health services in other specified circumstances: Secondary | ICD-10-CM

## 2022-03-23 NOTE — Patient Instructions (Addendum)
Medication Instructions:  Your physician recommends that you continue on your current medications as directed. Please refer to the Current Medication list given to you today.  *If you need a refill on your cardiac medications before your next appointment, please call your pharmacy*   Lab Work: None   Testing/Procedures: Your physician has requested that you have an echocardiogram. Echocardiography is a painless test that uses sound waves to create images of your heart. It provides your doctor with information about the size and shape of your heart and how well your heart's chambers and valves are working. This procedure takes approximately one hour. There are no restrictions for this procedure.    Follow-Up: At [facility], you and your health needs are our priority.  As part of our continuing mission to provide you with exceptional heart care, we have created designated Provider Care Teams.  These Care Teams include your primary Cardiologist (physician) and Advanced Practice Providers (APPs -  Physician Assistants and Nurse Practitioners) who all work together to provide you with the care you need, when you need it.  We recommend signing up for the patient portal called "MyChart".  Sign up information is provided on this After Visit Summary.  MyChart is used to connect with patients for Virtual Visits (Telemedicine).  Patients are able to view lab/test results, encounter notes, upcoming appointments, etc.  Non-urgent messages can be sent to your provider as well.   To learn more about what you can do with MyChart, go to [email or web address].    Your next appointment:   1 year(s)  The format for your next appointment:   In Person  Provider:   Kardie Tobb, DO     Other Instructions   Important Information About Sugar       

## 2022-03-23 NOTE — Progress Notes (Signed)
Cardio-Obstetrics Clinic  New Evaluation  Date:  03/26/2022   ID:  Brianna Acosta, DOB 1991/05/22, MRN [id number]  PCP:  Luetta Nutting, [place] Prov[facility] Cardiologist:  Berniece Salines, DO  Electrophysiologist:  None       Referring MD: Luetta Nutting, DO   Chief Complaint: " I am doing ok"  History of Present Illness:    Brianna Acosta is a 31 y.o. female [[id number]] who is being seen today for the evaluation of Mosaic Turner syndrome at the request of Luetta Nutting, DO.   She is currently pregnant with her second child.  Her first child is 73 years old.  She was able to deliver her first child via C-section due to fetal distress from placenta abruption.  She has had no trouble with this pregnancy.  She was referred to me because genetic testing does reveal evidence of mosaic Turner syndrome.  She does not have any first-degree family history of cardiovascular disease.  But her aunt who was perceived to be very healthy has had a myocardial infarction at a young/premature age with stent and also had a restent stenosis.  Prior CV Studies Reviewed: The following studies were reviewed today: None today   Past Medical History:  Diagnosis Date   Anemia    Mosaic Turner syndrome    Placenta previa     Past Surgical History:  Procedure Laterality Date   APPENDECTOMY     CESAREAN SECTION        OB History     Gravida  2   Para  1   Term  1   Preterm      AB      Living  1      SAB      IAB      Ectopic      Multiple      Live Births  1               Current Medications: Current Meds  Medication Sig   Loratadine 10 MG CAPS Take 10 mg by mouth at bedtime.   [DISCONTINUED] Docosahexaenoic Acid (PRENATAL DHA PO)      Allergies:   Patient has no known allergies.   Social History   Socioeconomic History   Marital status: Married    Spouse name: Not on file   Number of children: Not on file   Years of education: Not on file   Highest  education level: Not on file  Occupational History   Not on file  Tobacco Use   Smoking status: Never    Passive exposure: Never   Smokeless tobacco: Never  Vaping Use   Vaping Use: Never used  Substance and Sexual Activity   Alcohol use: Not Currently   Drug use: Never   Sexual activity: Yes    Partners: Male    Birth control/protection: None  Other Topics Concern   Not on file  Social History Narrative   Not on file   Social Determinants of Health   Financial Resource Strain: Not on file  Food Insecurity: Not on file  Transportation Needs: Not on file  Physical Activity: Not on file  Stress: Not on file  Social Connections: Not on file      Family History  Problem Relation Age of Onset   Diabetes Mother    Hyperthyroidism Mother    Hypertension Father    Cancer Maternal Grandfather       ROS:   Please  see the history of present illness.     All other systems reviewed and are negative.   Labs/EKG Reviewed:    EKG:   EKG is was ordered today.  The ekg ordered today demonstrates sinus rhythm  Recent Labs: 01/18/2022: Hemoglobin 13.5; Platelets 303   Recent Lipid Panel No results found for: "CHOL", "TRIG", "HDL", "CHOLHDL", "LDLCALC", "LDLDIRECT"  Physical Exam:    VS:  BP 112/70 (BP Location: Right Arm, Patient Position: Sitting, Cuff Size: Normal)   Pulse 98   Ht 5\' 4"  (1.626 m)   Wt 190 lb 3.2 oz (86.3 kg)   LMP 06/27/2021 (Exact Date)   SpO2 99%   BMI 32.65 kg/m     Wt Readings from Last 3 Encounters:  03/24/22 192 lb (87.1 kg)  03/23/22 190 lb 3.2 oz (86.3 kg)  03/17/22 190 lb (86.2 kg)     GEN:  Well nourished, well developed in no acute distress HEENT: Normal NECK: No JVD; No carotid bruits LYMPHATICS: No lymphadenopathy CARDIAC: RRR, no murmurs, rubs, gallops RESPIRATORY:  Clear to auscultation without rales, wheezing or rhonchi  ABDOMEN: Soft, non-tender, non-distended MUSCULOSKELETAL:  No edema; No deformity  SKIN: Warm and  dry NEUROLOGIC:  Alert and oriented x 3 PSYCHIATRIC:  Normal affect    Risk Assessment/Risk Calculators:     CARPREG II Risk Prediction Index Score:  1.  The patient's risk for a primary cardiac event is 5%.            ASSESSMENT & PLAN:    Mosaic Turner syndrome  She was referred by her OB team due to genetic testing that was like Turner syndrome.  She does not have any physical features of Turner's.  But we will get an echocardiogram to assess for any structural abnormalities at this time. She is agreeable to plan.  The patient is in agreement with the above plan. The patient left the office in stable condition.  The patient will follow up in 1 year    Patient Instructions  Medication Instructions:  Your physician recommends that you continue on your current medications as directed. Please refer to the Current Medication list given to you today.  *If you need a refill on your cardiac medications before your next appointment, please call your pharmacy*   Lab Work: None  Testing/Procedures: Your physician has requested that you have an echocardiogram. Echocardiography is a painless test that uses sound waves to create images of your heart. It provides your doctor with information about the size and shape of your heart and how well your heart's chambers and valves are working. This procedure takes approximately one hour. There are no restrictions for this procedure.    Follow-Up: At [facility], you and your health needs are our priority.  As part of our continuing mission to provide you with exceptional heart care, we have created designated Provider Care Teams.  These Care Teams include your primary Cardiologist (physician) and Advanced Practice Providers (APPs -  Physician Assistants and Nurse Practitioners) who all work together to provide you with the care you need, when you need it.  We recommend signing up for the patient portal called "MyChart".  Sign up  information is provided on this After Visit Summary.  MyChart is used to connect with patients for Virtual Visits (Telemedicine).  Patients are able to view lab/test results, encounter notes, upcoming appointments, etc.  Non-urgent messages can be sent to your provider as well.   To learn more about what you  can do with MyChart, go to [email or web address].    Your next appointment:   1 year(s)  The format for your next appointment:   In Person  Provider:   Thomasene Ripple, DO     Other Instructions   Important Information About Sugar         Dispo:  Return in about 1 year (around 03/24/2023).   Medication Adjustments/Labs and Tests Ordered: Current medicines are reviewed at length with the patient today.  Concerns regarding medicines are outlined above.  Tests Ordered: Orders Placed This Encounter  Procedures   EKG 12-Lead   ECHOCARDIOGRAM COMPLETE   Medication Changes: No orders of the defined types were placed in this encounter.

## 2022-03-24 ENCOUNTER — Ambulatory Visit (INDEPENDENT_AMBULATORY_CARE_PROVIDER_SITE_OTHER): Payer: BC Managed Care – PPO | Admitting: Obstetrics & Gynecology

## 2022-03-24 VITALS — BP 122/85 | HR 98 | Wt 192.0 lb

## 2022-03-24 DIAGNOSIS — Z98891 History of uterine scar from previous surgery: Secondary | ICD-10-CM

## 2022-03-24 DIAGNOSIS — Z3480 Encounter for supervision of other normal pregnancy, unspecified trimester: Secondary | ICD-10-CM

## 2022-03-24 DIAGNOSIS — Z3483 Encounter for supervision of other normal pregnancy, third trimester: Secondary | ICD-10-CM

## 2022-03-24 DIAGNOSIS — Q963 Mosaicism, 45, X/46, XX or XY: Secondary | ICD-10-CM

## 2022-03-24 DIAGNOSIS — Z3A38 38 weeks gestation of pregnancy: Secondary | ICD-10-CM

## 2022-03-24 NOTE — Progress Notes (Signed)
   PRENATAL VISIT NOTE  Subjective:  Brianna Acosta is a 31 y.o. G2P1001 at [redacted]w[redacted]d being seen today for ongoing prenatal care.  She is currently monitored for the following issues for this low-risk pregnancy and has Supervision of other normal pregnancy, antepartum; History of cesarean delivery; History of placenta abruption; Rubella non-immune status, antepartum; Seasonal allergies; Abnormal chromosomal and genetic finding on antenatal screening of mother; Mosaic Turner syndrome of the patient; Rh negative status during pregnancy; Abnormal head movements; Sciatica, unspecified side; and Back pain affecting pregnancy on their problem list.  Patient reports no complaints.  Contractions: Not present. Vag. Bleeding: None.  Movement: Present. Denies leaking of fluid.   The following portions of the patient's history were reviewed and updated as appropriate: allergies, current medications, past family history, past medical history, past social history, past surgical history and problem list.   Objective:   Vitals:   03/24/22 1426  BP: 122/85  Pulse: 98  Weight: 192 lb (87.1 kg)    Fetal Status: Fetal Heart Rate (bpm): 143   Movement: Present     General:  Alert, oriented and cooperative. Patient is in no acute distress.  Skin: Skin is warm and dry. No rash noted.   Cardiovascular: Normal heart rate noted  Respiratory: Normal respiratory effort, no problems with respiration noted  Abdomen: Soft, gravid, appropriate for gestational age.  Pain/Pressure: Absent     Pelvic: Cervical exam performed in the presence of a chaperone      closed/high/posterior  Extremities: Normal range of motion.  Edema: Trace  Mental Status: Normal mood and affect. Normal behavior. Normal judgment and thought content.   Assessment and Plan:  Pregnancy: G2P1001 at [redacted]w[redacted]d 1. Supervision of other normal pregnancy, antepartum C/S planned for Oct 10th  2. Mosaic Turner syndrome of the patient Cardiac eval by Dt. Tobb and  echo scheduled  Term labor symptoms and general obstetric precautions including but not limited to vaginal bleeding, contractions, leaking of fluid and fetal movement were reviewed in detail with the patient. Please refer to After Visit Summary for other counseling recommendations.   No follow-ups on file.  Future Appointments  Date Time Provider [place]  03/25/2022  4:05 PM [facility] ECHO 5 [id number]  03/29/2022  9:00 AM [facility] PAT 1 [facility] None    Silas [place], MD

## 2022-03-25 ENCOUNTER — Ambulatory Visit (HOSPITAL_COMMUNITY): Payer: BC Managed Care – PPO | Attending: Cardiology

## 2022-03-25 DIAGNOSIS — Q963 Mosaicism, 45, X/46, XX or XY: Secondary | ICD-10-CM | POA: Diagnosis not present

## 2022-03-25 LAB — ECHOCARDIOGRAM COMPLETE
Area-P 1/2: 4.29 cm2
S' Lateral: 2.8 cm

## 2022-03-29 ENCOUNTER — Other Ambulatory Visit (HOSPITAL_COMMUNITY)
Admission: RE | Admit: 2022-03-29 | Discharge: 2022-03-29 | Disposition: A | Discharge status: Home / Self Care | Payer: BC Managed Care – PPO | Source: Ambulatory Visit | Attending: Family Medicine | Admitting: Family Medicine

## 2022-03-29 DIAGNOSIS — Z01818 Encounter for other preprocedural examination: Secondary | ICD-10-CM | POA: Insufficient documentation

## 2022-03-29 DIAGNOSIS — Z8279 Family history of other congenital malformations, deformations and chromosomal abnormalities: Secondary | ICD-10-CM | POA: Diagnosis not present

## 2022-03-29 DIAGNOSIS — Z2989 Encounter for other specified prophylactic measures: Secondary | ICD-10-CM | POA: Diagnosis not present

## 2022-03-29 DIAGNOSIS — Z23 Encounter for immunization: Secondary | ICD-10-CM | POA: Diagnosis not present

## 2022-03-29 DIAGNOSIS — O34211 Maternal care for low transverse scar from previous cesarean delivery: Secondary | ICD-10-CM | POA: Diagnosis not present

## 2022-03-29 DIAGNOSIS — O34219 Maternal care for unspecified type scar from previous cesarean delivery: Secondary | ICD-10-CM | POA: Diagnosis not present

## 2022-03-29 DIAGNOSIS — O9902 Anemia complicating childbirth: Secondary | ICD-10-CM | POA: Diagnosis not present

## 2022-03-29 DIAGNOSIS — O26893 Other specified pregnancy related conditions, third trimester: Secondary | ICD-10-CM | POA: Diagnosis not present

## 2022-03-29 DIAGNOSIS — Z3A39 39 weeks gestation of pregnancy: Secondary | ICD-10-CM | POA: Diagnosis not present

## 2022-03-29 DIAGNOSIS — Z6791 Unspecified blood type, Rh negative: Secondary | ICD-10-CM | POA: Diagnosis not present

## 2022-03-29 LAB — CBC
HCT: 41.8 % (ref 36.0–46.0)
Hemoglobin: 14 g/dL (ref 12.0–15.0)
MCH: 28.9 pg (ref 26.0–34.0)
MCHC: 33.5 g/dL (ref 30.0–36.0)
MCV: 86.4 fL (ref 80.0–100.0)
Platelets: 282 10*3/uL (ref 150–400)
RBC: 4.84 MIL/uL (ref 3.87–5.11)
RDW: 13.3 % (ref 11.5–15.5)
WBC: 13.2 10*3/uL — ABNORMAL HIGH (ref 4.0–10.5)
nRBC: 0 % (ref 0.0–0.2)

## 2022-03-29 LAB — TYPE AND SCREEN
ABO/RH(D): B NEG
Antibody Screen: POSITIVE

## 2022-03-29 NOTE — Anesthesia Preprocedure Evaluation (Signed)
Anesthesia Evaluation  Patient identified by MRN, date of birth, ID band Patient awake    Reviewed: Allergy & Precautions, NPO status , Patient's Chart, lab work & pertinent test results  Airway Mallampati: II  TM Distance: >3 FB Neck ROM: Full    Dental no notable dental hx.    Pulmonary neg pulmonary ROS   Pulmonary exam normal breath sounds clear to auscultation       Cardiovascular Normal cardiovascular exam Rhythm:Regular Rate:Normal  Mosaic turner syn found on genetic screen- echo 03/25/22 normal    Neuro/Psych negative neurological ROS  negative psych ROS   GI/Hepatic negative GI ROS, Neg liver ROS,,,  Endo/Other  BMI 33  Renal/GU negative Renal ROS  negative genitourinary   Musculoskeletal negative musculoskeletal ROS (+)    Abdominal   Peds  Hematology Hb 14,  plt 282   Anesthesia Other Findings   Reproductive/Obstetrics (+) Pregnancy Previous section w/ abruption                              Anesthesia Physical Anesthesia Plan  ASA: 2  Anesthesia Plan: Spinal   Post-op Pain Management: Regional block, Toradol IV (intra-op) and Ofirmev IV (intra-op)*   Induction:   PONV Risk Score and Plan: 3 and Ondansetron, Dexamethasone and Treatment may vary due to age or medical condition  Airway Management Planned: Natural Airway and Nasal Cannula  Additional Equipment: None  Intra-op Plan:   Post-operative Plan:   Informed Consent: I have reviewed the patients History and Physical, chart, labs and discussed the procedure including the risks, benefits and alternatives for the proposed anesthesia with the patient or authorized representative who has indicated his/her understanding and acceptance.       Plan Discussed with: CRNA  Anesthesia Plan Comments:         Anesthesia Quick Evaluation

## 2022-03-29 NOTE — H&P (Signed)
OBSTETRIC ADMISSION HISTORY AND PHYSICAL  Brianna Acosta is a 31 y.o. female G2P1001 with IUP at [redacted]w[redacted]d by LMP presenting for elective rLTCS. She reports +FMs, No LOF, no VB, no blurry vision, headaches or peripheral edema, and RUQ pain.  She plans on breast feeding. She request outpatient mirena for birth control. 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 -Abnormal chromosomal genetic finding on antenatal screening -Rubella non-immune  Past Medical History: Past Medical History:  Diagnosis Date   Anemia    Mosaic Turner syndrome    Placenta previa     Past Surgical History: Past Surgical History:  Procedure Laterality Date   APPENDECTOMY     CESAREAN SECTION      Obstetrical History: OB History     Gravida  2   Para  1   Term  1   Preterm      AB      Living  1      SAB      IAB      Ectopic      Multiple      Live Births  1           Social History Social History   Socioeconomic History   Marital status: Married    Spouse name: Not on file   Number of children: Not on file   Years of education: Not on file   Highest education level: Not on file  Occupational History   Not on file  Tobacco Use   Smoking status: Never    Passive exposure: Never   Smokeless tobacco: Never  Vaping Use   Vaping Use: Never used  Substance and Sexual Activity   Alcohol use: Not Currently   Drug use: Never   Sexual activity: Yes    Partners: Male    Birth control/protection: None  Other Topics Concern   Not on file  Social History Narrative   Not on file   Social Determinants of Health   Financial Resource Strain: Not on file  Food Insecurity: Not on file  Transportation Needs: Not on file  Physical  Activity: Not on file  Stress: Not on file  Social Connections: Not on file    Family History: Family History  Problem Relation Age of Onset   Diabetes Mother    Hyperthyroidism Mother    Hypertension Father    Cancer Maternal Grandfather     Allergies: No Known Allergies  No medications prior to admission.     Review of Systems   All systems reviewed and negative except as stated in HPI  Blood pressure 107/84, pulse 80, temperature 98.2 F (36.8 C), temperature source Oral, resp. rate 20, height 5' 4" (1.626 m), weight 190 lb (86.2 kg), last menstrual period 06/27/2021, SpO2 100 %. General appearance: alert, cooperative, appears stated age, and no distress Lungs: normal effort Heart: regular rate noted Abdomen: gravid. Previous incision visualized. Pelvic: Deferred Extremities: Homans sign is negative, no sign of DVT. SCDs on.  Presentation: cephalic per US FHT: 135 bpm     Prenatal labs: ABO, Rh: --/--/B NEG (10/09 0914) Antibody: POS (10/09 0914) Rubella: <0.90 (03/07 1525) RPR: NON-REACTIVE (07/31 0920)  HBsAg: NON-REACTIVE (03/07 1525)  HIV: NON-REACTIVE (07/31 0920)    GBS:    1 hr Glucola 153 Genetic screening   On cell-free fetal DNA screening, atypical finding on sex chromosomes were seen.  Subsequently, the patient had her chromosomes analyzed and showed mosaic Turner syndrome with 2 cell lines: 45,X[4]/46,XX[26] (normal female GTG banded metaphases were observed in 26 of 30 cells, and the remaining 4 cells had only one X chromosome (45,X)).The microarray result also shows X/XX mosaicism: arr(1-22)x2,(X)x1 2. Patient had met with our genetic counselor and opted not to have amniocentesis. Anatomy US female, wnl  Prenatal Transfer Tool  Maternal Diabetes: No Genetic Screening: Abnormal:  Results: Other: On cell-free fetal DNA screening, atypical finding on sex chromosomes were seen. Subsequently, the patient had her chromosomes analyzed and showed mosaic  Turner syndrome with 2 cell lines: 45,X[4]/46,XX[26] (normal female GTG banded metaphases were observed in 26 of 30 cells, and the remaining 4 cells had only one X chromosome (45,X)).The microarray result also shows X/XX mosaicism: arr(1-22)x2,(X)x1 2. Patient had met with our genetic counselor and opted not to have amniocentesis. Maternal Ultrasounds/Referrals: Normal Fetal Ultrasounds or other Referrals:  None Maternal Substance Abuse:  No Significant Maternal Medications:  None Significant Maternal Lab Results:  Group B Strep negative and Rh negative Number of Prenatal Visits:greater than 3 verified prenatal visits Other Comments:  None  Results for orders placed or performed during the hospital encounter of 03/29/22 (from the past 24 hour(s))  CBC   Collection Time: 03/29/22  9:13 AM  Result Value Ref Range   WBC 13.2 (H) 4.0 - 10.5 K/uL   RBC 4.84 3.87 - 5.11 MIL/uL   Hemoglobin 14.0 12.0 - 15.0 g/dL   HCT 41.8 36.0 - 46.0 %   MCV 86.4 80.0 - 100.0 fL   MCH 28.9 26.0 - 34.0 pg   MCHC 33.5 30.0 - 36.0 g/dL   RDW 13.3 11.5 - 15.5 %   Platelets 282 150 - 400 K/uL   nRBC 0.0 0.0 - 0.2 %  Type and screen [facility]   Collection Time: 03/29/22  9:14 AM  Result Value Ref Range   ABO/RH(D) B NEG    Antibody Screen POS    Sample Expiration 04/01/2022,2359    Antibody Identification      PASSIVELY ACQUIRED ANTI-D Performed at [facility], [address] [place], [place] [address]     Patient Active Problem List   Diagnosis Date Noted   Back pain affecting pregnancy 01/18/2022   Abnormal head movements 11/25/2021   Sciatica, unspecified side 11/25/2021   Rh negative status during pregnancy 11/19/2021   Mosaic Turner syndrome of the patient 11/10/2021   Abnormal chromosomal and genetic finding on antenatal screening of mother 09/29/2021   Seasonal allergies 09/15/2021   Rubella non-immune status, antepartum 08/30/2021   History of cesarean delivery  08/25/2021   History of placenta abruption 08/25/2021   Supervision of other normal pregnancy, antepartum 08/03/2021    Assessment/Plan:  Brianna Acosta is a 31 y.o. G2P1001 at [redacted]w[redacted]d here for rLCTS 2/2 hx of placenta abruption.   The risks of surgery were discussed with the patient including but were not limited to: bleeding which may require transfusion or reoperation; infection which may require antibiotics; injury to bowel, bladder, ureters or other surrounding organs; injury to the fetus; need for additional procedures including hysterectomy in the event of a life-threatening hemorrhage; formation of adhesions; placental abnormalities wth subsequent pregnancies; incisional problems; thromboembolic phenomenon and other postoperative/anesthesia complications.   The patient concurred with the proposed   plan, giving informed written consent for the procedures.  Antibiotics ordered.  To OR when ready.   Hgb 14 on 10/9  -MMR pp  #FHT: 135 bpm #ID:  GBS neg, Rubella non-immune #MOF: Breast #MOC: outpt IUD (mirena) #Circ:  Female, yes  Brianna Ngu Autry-Lott, DO  03/29/2022, 10:34 PM

## 2022-03-30 ENCOUNTER — Inpatient Hospital Stay (HOSPITAL_COMMUNITY): Payer: BC Managed Care – PPO | Admitting: Anesthesiology

## 2022-03-30 ENCOUNTER — Encounter (HOSPITAL_COMMUNITY)
Admission: RE | Disposition: A | Discharge status: Home / Self Care | Payer: Self-pay | Source: Home / Self Care | Attending: Obstetrics & Gynecology

## 2022-03-30 ENCOUNTER — Other Ambulatory Visit: Payer: Self-pay

## 2022-03-30 ENCOUNTER — Inpatient Hospital Stay (HOSPITAL_COMMUNITY)
Admission: RE | Admit: 2022-03-30 | Discharge: 2022-04-01 | DRG: 788 | Disposition: A | Discharge status: Home / Self Care | Payer: BC Managed Care – PPO | Attending: Obstetrics & Gynecology | Admitting: Obstetrics & Gynecology

## 2022-03-30 ENCOUNTER — Other Ambulatory Visit: Payer: Self-pay | Admitting: Obstetrics & Gynecology

## 2022-03-30 ENCOUNTER — Encounter (HOSPITAL_COMMUNITY): Payer: Self-pay | Admitting: Family Medicine

## 2022-03-30 DIAGNOSIS — O34211 Maternal care for low transverse scar from previous cesarean delivery: Principal | ICD-10-CM | POA: Diagnosis present

## 2022-03-30 DIAGNOSIS — Z98891 History of uterine scar from previous surgery: Secondary | ICD-10-CM

## 2022-03-30 DIAGNOSIS — Z23 Encounter for immunization: Secondary | ICD-10-CM

## 2022-03-30 DIAGNOSIS — Z6791 Unspecified blood type, Rh negative: Secondary | ICD-10-CM

## 2022-03-30 DIAGNOSIS — Z01818 Encounter for other preprocedural examination: Secondary | ICD-10-CM

## 2022-03-30 DIAGNOSIS — O26893 Other specified pregnancy related conditions, third trimester: Secondary | ICD-10-CM | POA: Diagnosis present

## 2022-03-30 DIAGNOSIS — Z3A39 39 weeks gestation of pregnancy: Secondary | ICD-10-CM

## 2022-03-30 DIAGNOSIS — O9902 Anemia complicating childbirth: Secondary | ICD-10-CM | POA: Diagnosis present

## 2022-03-30 LAB — RPR: RPR Ser Ql: NONREACTIVE

## 2022-03-30 LAB — ABO/RH: ABO/RH(D): B NEG

## 2022-03-30 SURGERY — Surgical Case
Anesthesia: Spinal

## 2022-03-30 MED ORDER — DEXAMETHASONE SODIUM PHOSPHATE 10 MG/ML IJ SOLN
INTRAMUSCULAR | Status: AC
  Filled 2022-03-30: qty 1

## 2022-03-30 MED ORDER — MEASLES, MUMPS & RUBELLA VAC IJ SOLR
0.5000 mL | Freq: Once | INTRAMUSCULAR | Status: AC
  Administered 2022-04-01: 0.5 mL via SUBCUTANEOUS
  Filled 2022-03-30: qty 0.5

## 2022-03-30 MED ORDER — CEFAZOLIN SODIUM-DEXTROSE 2-4 GM/100ML-% IV SOLN
INTRAVENOUS | Status: AC
  Filled 2022-03-30: qty 100

## 2022-03-30 MED ORDER — FENTANYL CITRATE (PF) 100 MCG/2ML IJ SOLN
INTRAMUSCULAR | Status: DC | PRN
  Administered 2022-03-30: 15 ug via INTRATHECAL

## 2022-03-30 MED ORDER — CEFAZOLIN SODIUM-DEXTROSE 2-4 GM/100ML-% IV SOLN: 2.0000 g | INTRAVENOUS | Status: DC

## 2022-03-30 MED ORDER — TETANUS-DIPHTH-ACELL PERTUSSIS 5-2.5-18.5 LF-MCG/0.5 IM SUSY: 0.5000 mL | PREFILLED_SYRINGE | Freq: Once | INTRAMUSCULAR | Status: DC

## 2022-03-30 MED ORDER — POVIDONE-IODINE 10 % EX SWAB
2.0000 | Freq: Once | CUTANEOUS | Status: AC
  Administered 2022-03-30: 2 via TOPICAL

## 2022-03-30 MED ORDER — ACETAMINOPHEN 500 MG PO TABS: 1000.0000 mg | ORAL_TABLET | Freq: Four times a day (QID) | ORAL | Status: DC

## 2022-03-30 MED ORDER — ENOXAPARIN SODIUM 40 MG/0.4ML IJ SOSY
40.0000 mg | PREFILLED_SYRINGE | INTRAMUSCULAR | Status: DC
  Administered 2022-03-31 – 2022-04-01 (×2): 40 mg via SUBCUTANEOUS
  Filled 2022-03-30 (×2): qty 0.4

## 2022-03-30 MED ORDER — NALOXONE HCL 0.4 MG/ML IJ SOLN: 0.4000 mg | INTRAMUSCULAR | Status: DC | PRN

## 2022-03-30 MED ORDER — COCONUT OIL OIL
1.0000 | TOPICAL_OIL | Status: DC | PRN
  Administered 2022-04-01: 1 via TOPICAL

## 2022-03-30 MED ORDER — BUPIVACAINE IN DEXTROSE 0.75-8.25 % IT SOLN
INTRATHECAL | Status: DC | PRN
  Administered 2022-03-30: 1.6 mL via INTRATHECAL

## 2022-03-30 MED ORDER — IBUPROFEN 600 MG PO TABS: 600.0000 mg | ORAL_TABLET | Freq: Four times a day (QID) | ORAL | Status: DC

## 2022-03-30 MED ORDER — SIMETHICONE 80 MG PO CHEW: 80.0000 mg | CHEWABLE_TABLET | ORAL | Status: DC | PRN

## 2022-03-30 MED ORDER — DIPHENHYDRAMINE HCL 25 MG PO CAPS: 25.0000 mg | ORAL_CAPSULE | Freq: Four times a day (QID) | ORAL | Status: DC | PRN

## 2022-03-30 MED ORDER — ACETAMINOPHEN 10 MG/ML IV SOLN
INTRAVENOUS | Status: DC | PRN
  Administered 2022-03-30: 1000 mg via INTRAVENOUS

## 2022-03-30 MED ORDER — KETOROLAC TROMETHAMINE 30 MG/ML IJ SOLN: 30.0000 mg | Freq: Four times a day (QID) | INTRAMUSCULAR | Status: AC | PRN

## 2022-03-30 MED ORDER — OXYCODONE HCL 5 MG PO TABS: 5.0000 mg | ORAL_TABLET | ORAL | Status: DC | PRN

## 2022-03-30 MED ORDER — LACTATED RINGERS IV SOLN: INTRAVENOUS | Status: DC | PRN

## 2022-03-30 MED ORDER — FENTANYL CITRATE (PF) 100 MCG/2ML IJ SOLN
INTRAMUSCULAR | Status: AC
  Filled 2022-03-30: qty 2

## 2022-03-30 MED ORDER — LACTATED RINGERS IV SOLN: INTRAVENOUS | Status: DC

## 2022-03-30 MED ORDER — SENNOSIDES-DOCUSATE SODIUM 8.6-50 MG PO TABS
2.0000 | ORAL_TABLET | Freq: Every day | ORAL | Status: DC
  Administered 2022-03-31 – 2022-04-01 (×2): 2 via ORAL
  Filled 2022-03-30 (×2): qty 2

## 2022-03-30 MED ORDER — KETOROLAC TROMETHAMINE 30 MG/ML IJ SOLN
30.0000 mg | Freq: Four times a day (QID) | INTRAMUSCULAR | Status: DC
  Administered 2022-03-30 (×2): 30 mg via INTRAVENOUS
  Filled 2022-03-30 (×3): qty 1

## 2022-03-30 MED ORDER — OXYTOCIN-SODIUM CHLORIDE 30-0.9 UT/500ML-% IV SOLN
INTRAVENOUS | Status: DC | PRN
  Administered 2022-03-30 (×2): 30 [IU] via INTRAVENOUS

## 2022-03-30 MED ORDER — SODIUM CHLORIDE 0.9% FLUSH: 3.0000 mL | INTRAVENOUS | Status: DC | PRN

## 2022-03-30 MED ORDER — ACETAMINOPHEN 500 MG PO TABS
1000.0000 mg | ORAL_TABLET | Freq: Four times a day (QID) | ORAL | Status: DC
  Administered 2022-03-30 – 2022-04-01 (×7): 1000 mg via ORAL
  Filled 2022-03-30 (×7): qty 2

## 2022-03-30 MED ORDER — DIPHENHYDRAMINE HCL 25 MG PO CAPS: 25.0000 mg | ORAL_CAPSULE | ORAL | Status: DC | PRN

## 2022-03-30 MED ORDER — GABAPENTIN 100 MG PO CAPS
100.0000 mg | ORAL_CAPSULE | Freq: Two times a day (BID) | ORAL | Status: DC
  Administered 2022-03-30 – 2022-04-01 (×4): 100 mg via ORAL
  Filled 2022-03-30 (×4): qty 1

## 2022-03-30 MED ORDER — DIPHENHYDRAMINE HCL 50 MG/ML IJ SOLN: 12.5000 mg | INTRAMUSCULAR | Status: DC | PRN

## 2022-03-30 MED ORDER — TRANEXAMIC ACID-NACL 1000-0.7 MG/100ML-% IV SOLN
INTRAVENOUS | Status: DC | PRN
  Administered 2022-03-30: 1000 mg via INTRAVENOUS

## 2022-03-30 MED ORDER — PHENYLEPHRINE HCL (PRESSORS) 10 MG/ML IV SOLN
INTRAVENOUS | Status: DC | PRN
  Administered 2022-03-30: 160 ug via INTRAVENOUS

## 2022-03-30 MED ORDER — PHENYLEPHRINE HCL-NACL 20-0.9 MG/250ML-% IV SOLN
INTRAVENOUS | Status: DC | PRN
  Administered 2022-03-30: 60 ug/min via INTRAVENOUS

## 2022-03-30 MED ORDER — MENTHOL 3 MG MT LOZG: 1.0000 | LOZENGE | OROMUCOSAL | Status: DC | PRN

## 2022-03-30 MED ORDER — OXYTOCIN-SODIUM CHLORIDE 30-0.9 UT/500ML-% IV SOLN: 2.5000 [IU]/h | INTRAVENOUS | Status: AC

## 2022-03-30 MED ORDER — SCOPOLAMINE 1 MG/3DAYS TD PT72
MEDICATED_PATCH | TRANSDERMAL | Status: AC
  Filled 2022-03-30: qty 1

## 2022-03-30 MED ORDER — WITCH HAZEL-GLYCERIN EX PADS: 1.0000 | MEDICATED_PAD | CUTANEOUS | Status: DC | PRN

## 2022-03-30 MED ORDER — OXYTOCIN-SODIUM CHLORIDE 30-0.9 UT/500ML-% IV SOLN
INTRAVENOUS | Status: AC
  Filled 2022-03-30: qty 500

## 2022-03-30 MED ORDER — ONDANSETRON HCL 4 MG/2ML IJ SOLN
INTRAMUSCULAR | Status: DC | PRN
  Administered 2022-03-30: 4 mg via INTRAVENOUS

## 2022-03-30 MED ORDER — ONDANSETRON HCL 4 MG/2ML IJ SOLN
INTRAMUSCULAR | Status: AC
  Filled 2022-03-30: qty 2

## 2022-03-30 MED ORDER — ONDANSETRON HCL 4 MG/2ML IJ SOLN: 4.0000 mg | Freq: Three times a day (TID) | INTRAMUSCULAR | Status: DC | PRN

## 2022-03-30 MED ORDER — PHENYLEPHRINE HCL-NACL 20-0.9 MG/250ML-% IV SOLN
INTRAVENOUS | Status: AC
  Filled 2022-03-30: qty 250

## 2022-03-30 MED ORDER — DEXAMETHASONE SODIUM PHOSPHATE 4 MG/ML IJ SOLN
INTRAMUSCULAR | Status: DC | PRN
  Administered 2022-03-30: 10 mg via INTRAVENOUS

## 2022-03-30 MED ORDER — DIBUCAINE (PERIANAL) 1 % EX OINT: 1.0000 | TOPICAL_OINTMENT | CUTANEOUS | Status: DC | PRN

## 2022-03-30 MED ORDER — MORPHINE SULFATE (PF) 0.5 MG/ML IJ SOLN
INTRAMUSCULAR | Status: DC | PRN
  Administered 2022-03-30: 150 ug via INTRATHECAL

## 2022-03-30 MED ORDER — SIMETHICONE 80 MG PO CHEW
80.0000 mg | CHEWABLE_TABLET | Freq: Three times a day (TID) | ORAL | Status: DC
  Administered 2022-03-30 – 2022-04-01 (×6): 80 mg via ORAL
  Filled 2022-03-30 (×6): qty 1

## 2022-03-30 MED ORDER — KETOROLAC TROMETHAMINE 30 MG/ML IJ SOLN
INTRAMUSCULAR | Status: DC | PRN
  Administered 2022-03-30: 30 mg via INTRAVENOUS

## 2022-03-30 MED ORDER — MORPHINE SULFATE (PF) 0.5 MG/ML IJ SOLN
INTRAMUSCULAR | Status: AC
  Filled 2022-03-30: qty 10

## 2022-03-30 MED ORDER — CEFAZOLIN SODIUM-DEXTROSE 2-3 GM-%(50ML) IV SOLR
INTRAVENOUS | Status: DC | PRN
  Administered 2022-03-30: 2 g via INTRAVENOUS

## 2022-03-30 MED ORDER — NALOXONE HCL 4 MG/10ML IJ SOLN: 1.0000 ug/kg/h | INTRAVENOUS | Status: DC | PRN

## 2022-03-30 MED ORDER — SCOPOLAMINE 1 MG/3DAYS TD PT72
1.0000 | MEDICATED_PATCH | Freq: Once | TRANSDERMAL | Status: DC
  Administered 2022-03-30: 1.5 mg via TRANSDERMAL

## 2022-03-30 MED ORDER — PRENATAL MULTIVITAMIN CH
1.0000 | ORAL_TABLET | Freq: Every day | ORAL | Status: DC
  Administered 2022-03-31 – 2022-04-01 (×2): 1 via ORAL
  Filled 2022-03-30 (×2): qty 1

## 2022-03-30 MED ORDER — ACETAMINOPHEN 10 MG/ML IV SOLN
INTRAVENOUS | Status: AC
  Filled 2022-03-30: qty 100

## 2022-03-30 SURGICAL SUPPLY — 36 items
BENZOIN TINCTURE PRP APPL 2/3 (GAUZE/BANDAGES/DRESSINGS) IMPLANT
CHLORAPREP W/TINT 26ML (MISCELLANEOUS) ×2 IMPLANT
CLAMP UMBILICAL CORD (MISCELLANEOUS) ×1 IMPLANT
CLOTH BEACON ORANGE TIMEOUT ST (SAFETY) ×1 IMPLANT
DERMABOND ADVANCED .7 DNX12 (GAUZE/BANDAGES/DRESSINGS) ×2 IMPLANT
DERMABOND ADVANCED .7 DNX6 (GAUZE/BANDAGES/DRESSINGS) IMPLANT
DRSG OPSITE POSTOP 4X10 (GAUZE/BANDAGES/DRESSINGS) ×1 IMPLANT
ELECT REM PT RETURN 9FT ADLT (ELECTROSURGICAL) ×1
ELECTRODE REM PT RTRN 9FT ADLT (ELECTROSURGICAL) ×1 IMPLANT
EXTRACTOR VACUUM M CUP 4 TUBE (SUCTIONS) IMPLANT
GAUZE SPONGE 4X4 12PLY STRL LF (GAUZE/BANDAGES/DRESSINGS) IMPLANT
GLOVE BIOGEL PI IND STRL 7.0 (GLOVE) ×2 IMPLANT
GLOVE BIOGEL PI IND STRL 7.5 (GLOVE) ×2 IMPLANT
GLOVE ECLIPSE 7.5 STRL STRAW (GLOVE) ×1 IMPLANT
GOWN STRL REUS W/TWL LRG LVL3 (GOWN DISPOSABLE) ×3 IMPLANT
KIT ABG SYR 3ML LUER SLIP (SYRINGE) IMPLANT
NDL HYPO 25X5/8 SAFETYGLIDE (NEEDLE) IMPLANT
NEEDLE HYPO 25X5/8 SAFETYGLIDE (NEEDLE) IMPLANT
NS IRRIG 1000ML POUR BTL (IV SOLUTION) ×1 IMPLANT
PACK C SECTION WH (CUSTOM PROCEDURE TRAY) ×1 IMPLANT
PAD ABD 7.5X8 STRL (GAUZE/BANDAGES/DRESSINGS) IMPLANT
PAD OB MATERNITY 4.3X12.25 (PERSONAL CARE ITEMS) ×1 IMPLANT
RTRCTR C-SECT PINK 25CM LRG (MISCELLANEOUS) ×1 IMPLANT
STRIP CLOSURE SKIN 1/2X4 (GAUZE/BANDAGES/DRESSINGS) IMPLANT
SUT MNCRL 0 VIOLET CTX 36 (SUTURE) ×2 IMPLANT
SUT MONOCRYL 0 CTX 36 (SUTURE) ×2
SUT PLAIN 2 0 (SUTURE) ×1
SUT PLAIN ABS 2-0 CT1 27XMFL (SUTURE) IMPLANT
SUT VIC AB 0 CTX 36 (SUTURE) ×1
SUT VIC AB 0 CTX36XBRD ANBCTRL (SUTURE) ×1 IMPLANT
SUT VIC AB 2-0 CT1 27 (SUTURE) ×1
SUT VIC AB 2-0 CT1 TAPERPNT 27 (SUTURE) ×1 IMPLANT
SUT VIC AB 4-0 KS 27 (SUTURE) ×1 IMPLANT
TOWEL OR 17X24 6PK STRL BLUE (TOWEL DISPOSABLE) ×1 IMPLANT
TRAY FOLEY W/BAG SLVR 14FR LF (SET/KITS/TRAYS/PACK) ×1 IMPLANT
WATER STERILE IRR 1000ML POUR (IV SOLUTION) ×1 IMPLANT

## 2022-03-30 NOTE — Anesthesia Procedure Notes (Signed)
Spinal  Patient location during procedure: OR Start time: 03/30/2022 9:18 AM End time: 03/30/2022 9:23 AM Reason for block: surgical anesthesia Staffing Performed: anesthesiologist  Anesthesiologist: Pervis Hocking, DO Performed by: Pervis Hocking, DO Authorized by: Pervis Hocking, DO   Preanesthetic Checklist Completed: patient identified, IV checked, risks and benefits discussed, surgical consent, monitors and equipment checked, pre-op evaluation and timeout performed Spinal Block Patient position: sitting Prep: DuraPrep and site prepped and draped Patient monitoring: cardiac monitor, continuous pulse ox and blood pressure Approach: midline Location: L3-4 Injection technique: single-shot Needle Needle type: Pencan  Needle gauge: 24 G Needle length: 9 cm Assessment Sensory level: T6 Events: CSF return Additional Notes Functioning IV was confirmed and monitors were applied. Sterile prep and drape, including hand hygiene and sterile gloves were used. The patient was positioned and the spine was prepped. The skin was anesthetized with lidocaine.  Free flow of clear CSF was obtained prior to injecting local anesthetic into the CSF.  The spinal needle aspirated freely following injection.  The needle was carefully withdrawn.  The patient tolerated the procedure well.   Performed by sRNA under direct supervision, no issues.

## 2022-03-30 NOTE — Transfer of Care (Signed)
Immediate Anesthesia Transfer of Care Note  Patient: Brianna Acosta  Procedure(s) Performed: CESAREAN SECTION  Patient Location: PACU  Anesthesia Type:Spinal  Level of Consciousness: awake, alert , and patient cooperative  Airway & Oxygen Therapy: Patient Spontanous Breathing  Post-op Assessment: Report given to RN and Post -op Vital signs reviewed and stable  Post vital signs: Reviewed and stable  Last Vitals:  Vitals Value Taken Time  BP 96/76 03/30/22 1036  Temp    Pulse 89 03/30/22 1037  Resp 15 03/30/22 1037  SpO2 100 % 03/30/22 1037  Vitals shown include unvalidated device data.  Last Pain:  Vitals:   03/30/22 0754  TempSrc: Oral         Complications: No notable events documented.

## 2022-03-30 NOTE — Anesthesia Postprocedure Evaluation (Signed)
Anesthesia Post Note  Patient: Brianna Acosta  Procedure(s) Performed: CESAREAN SECTION     Patient location during evaluation: PACU Anesthesia Type: Spinal Level of consciousness: awake and alert and oriented Pain management: pain level controlled Vital Signs Assessment: post-procedure vital signs reviewed and stable Respiratory status: spontaneous breathing, nonlabored ventilation and respiratory function stable Cardiovascular status: blood pressure returned to baseline and stable Postop Assessment: no headache, no backache, spinal receding and no apparent nausea or vomiting Anesthetic complications: no   No notable events documented.  Last Vitals:  Vitals:   03/30/22 1130 03/30/22 1144  BP: 108/78 108/67  Pulse: 98 80  Resp: (!) 33   Temp:  (!) 36.1 C  SpO2: 100% 98%    Last Pain:  Vitals:   03/30/22 1144  TempSrc: Oral  PainSc:    Pain Goal: Patients Stated Pain Goal: 5 (03/30/22 1039)  LLE Motor Response: Purposeful movement (03/30/22 1115) LLE Sensation: Tingling (03/30/22 1115) RLE Motor Response: Purposeful movement (03/30/22 1115) RLE Sensation: Tingling (03/30/22 1115) L Sensory Level: L2-Upper inner thigh, upper buttock (03/30/22 1115) R Sensory Level: L2-Upper inner thigh, upper buttock (03/30/22 1115) Epidural/Spinal Function Cutaneous sensation: Able to Discern Pressure (03/30/22 1115), Patient able to flex knees: No (03/30/22 1115), Patient able to lift hips off bed: No (03/30/22 1115), Back pain beyond tenderness at insertion site: No (03/30/22 1115), Progressively worsening motor and/or sensory loss: No (03/30/22 1115)  Pervis Hocking

## 2022-03-30 NOTE — Discharge Summary (Cosign Needed Addendum)
Postpartum Discharge Summary     Patient Name: Brianna Acosta DOB: 17-May-1991 MRN: [id number]  Date of admission: 03/30/2022 Delivery date:03/30/2022  Delivering provider: Truett Mainland  Date of discharge: 04/01/2022  Admitting diagnosis: S/P cesarean section [Z98.891] Intrauterine pregnancy: [redacted]w[redacted]d     Secondary diagnosis:  Principal Problem:   S/P cesarean section  Additional problems: none    Discharge diagnosis: Term Pregnancy Delivered                                              Post partum procedures: none Augmentation: N/A Complications: None  Hospital course: Scheduled C/S   31 y.o. yo G2P2002 at [redacted]w[redacted]d was admitted to the hospital 03/30/2022 for scheduled cesarean section with the following indication:Elective Repeat and hx of placental abruption .Delivery details are as follows:  Membrane Rupture Time/Date: 9:46 AM ,03/30/2022   Delivery Method:C-Section, Low Transverse  Details of operation can be found in separate operative note.  Patient had a postpartum course complicated by nothing.  She is ambulating, tolerating a regular diet, passing flatus, had BM, and urinating well. Breast feeding without issue. Patient is discharged home in stable condition on  04/01/22        Newborn Data: Birth date:03/30/2022  Birth time:9:48 AM  Gender:Female  Living status:Living  Apgars:9 ,9  Weight:3310 g (7lb 4.8oz)    Magnesium Sulfate received: No BMZ received: No Rhophylac:N/A MMR:No T-DaP:Given prenatally Flu: No Transfusion:No  Physical exam  Vitals:   03/31/22 0420 03/31/22 1513 03/31/22 2245 04/01/22 0501  BP: (!) 99/58 100/60 106/65 114/76  Pulse: 77 80 76 76  Resp: $Remo'18 18 18 18  'qdEWJ$ Temp: 98.3 F (36.8 C) 98 F (36.7 C) 98.4 F (36.9 C) 98.1 F (36.7 C)  TempSrc: Oral Oral Oral Oral  SpO2: 99% 100% 98% 100%  Weight:      Height:       General: alert, cooperative, and no distress Lochia: appropriate Uterine Fundus: firm Incision: Healing well with no  significant drainage DVT Evaluation: No evidence of DVT seen on physical exam. Negative Homan's sign. No cords or calf tenderness. No significant calf/ankle edema. Labs: Lab Results  Component Value Date   WBC 17.4 (H) 03/31/2022   HGB 11.6 (L) 03/31/2022   HCT 32.7 (L) 03/31/2022   MCV 83.0 03/31/2022   PLT 255 03/31/2022       No data to display         Edinburgh Score:    03/30/2022   12:16 PM  Edinburgh Postnatal Depression Scale Screening Tool  I have been able to laugh and see the funny side of things. 0  I have looked forward with enjoyment to things. 0  I have blamed myself unnecessarily when things went wrong. 1  I have been anxious or worried for no good reason. 0  I have felt scared or panicky for no good reason. 0  Things have been getting on top of me. 0  I have been so unhappy that I have had difficulty sleeping. 0  I have felt sad or miserable. 0  I have been so unhappy that I have been crying. 0  The thought of harming myself has occurred to me. 0  Edinburgh Postnatal Depression Scale Total 1     After visit meds:  Allergies as of 04/01/2022   No Known Allergies  Medication List     TAKE these medications    acetaminophen 500 MG tablet Commonly known as: TYLENOL Take 2 tablets (1,000 mg total) by mouth every 6 (six) hours.   coconut oil Oil Apply 1 Application topically as needed.   ibuprofen 600 MG tablet Commonly known as: ADVIL Take 1 tablet (600 mg total) by mouth every 6 (six) hours.   Loratadine 10 MG Caps Take 10 mg by mouth at bedtime.   oxyCODONE 5 MG immediate release tablet Commonly known as: Oxy IR/ROXICODONE Take 1-2 tablets (5-10 mg total) by mouth every 4 (four) hours as needed for moderate pain.   PRENATAL+DHA PO Take 1 tablet by mouth daily.         Discharge home in stable condition Infant Feeding: Breast Infant Disposition:home with mother Discharge instruction: per After Visit Summary and Postpartum  booklet. Activity: Advance as tolerated. Pelvic rest for 6 weeks.  Diet: routine diet Future Appointments: Future Appointments  Date Time Provider [place]  04/06/2022  3:10 PM Renee Harder, CNM [facility]  05/10/2022  1:10 PM Radene Gunning, MD [facility]   Follow up Visit:  Message sent to [facility] by Autry-Lott on 04/01/2022  Please schedule this patient for a In person postpartum visit in 6 weeks with the following provider: Any provider. Additional Postpartum F/U:Incision check 1 week  Low risk pregnancy complicated by:  Rh neg status, hx of CS, hx of placental abruption Delivery mode:  C-Section, Low Transverse  Anticipated Birth Control:  IUD-Mirena   04/01/2022 Cecilio Asper, MD  CNM attestation I have seen and examined this patient and agree with above documentation in the resident's note.   Brianna Acosta is a 31 y.o. [id number] s/p rLTCS.   Pain is well controlled.  Plan for birth control is IUD.  Method of Feeding: breast  PE:  BP 114/76 (BP Location: Left Arm)   Pulse 76   Temp 98.1 F (36.7 C) (Oral)   Resp 18   Ht $R'5\' 4"'bR$  (1.626 m)   Wt 86.2 kg   LMP 06/27/2021 (Exact Date)   SpO2 100%   Breastfeeding Unknown   BMI 32.61 kg/m  Fundus firm  Recent Labs    03/31/22 0431  HGB 11.6*  HCT 32.7*     Plan: discharge today - postpartum care discussed - f/u clinic in 1wk for incision check, then 4-6 weeks for postpartum visit   Myrtis Ser, CNM 1:51 PM

## 2022-03-30 NOTE — Lactation Note (Signed)
This note was copied from a baby's chart. Lactation Consultation Note  Patient Name: Brianna Acosta EAVWU'[id number] Date: 03/30/2022 Reason for consult: Initial assessment;Term Age:30 hours Experienced BF mom stated BF going well. Needed some assistance w/positioning and support. Mom stated getting use to BF a newborn is different. Mom had baby latched when [place] came into rm. Baby was coming off and on. Could see nipple going in and out of baby's mouth. Made suggestions on repositioning and support. Mom found comfortable.  Newborn feeding habits, STS, I&O reviewed. Mom feels that feedings are going well. Encouraged to call for assistance as needed. Mom asked for a spoon in case he got sleepy and she needed to hand express and spoon feed him colostrum.  Maternal Data Has patient been taught Hand Expression?: Yes Does the patient have breastfeeding experience prior to this delivery?: Yes How long did the patient breastfeed?: 2 1/2 yrs  Feeding    LATCH Score Latch: Grasps breast easily, tongue down, lips flanged, rhythmical sucking.  Audible Swallowing: None  Type of Nipple: Everted at rest and after stimulation  Comfort (Breast/Nipple): Soft / non-tender  Hold (Positioning): No assistance needed to correctly position infant at breast.  LATCH Score: 8   Lactation Tools Discussed/Used    Interventions Interventions: Breast feeding basics reviewed;Skin to skin;Breast compression;Adjust position;Support pillows;Position options;LC Services brochure  Discharge    Consult Status Consult Status: Follow-up Date: 03/31/22 Follow-up type: In-patient    Theodoro Kalata 03/30/2022, 11:04 PM

## 2022-03-30 NOTE — Op Note (Signed)
Brianna Acosta PROCEDURE DATE: 03/30/2022  PREOPERATIVE DIAGNOSES: Intrauterine pregnancy at [redacted]w[redacted]d weeks gestation;  hx of placenta abruption  POSTOPERATIVE DIAGNOSES: The same  PROCEDURE: Low Transverse Cesarean Section  SURGEON:  Dr. Adrian Blackwater  ASSISTANT:  Dr. Salvadore Dom. An experienced assistant was required given the standard of surgical care given the complexity of the case.  This assistant was needed for exposure, dissection, suctioning, retraction, instrument exchange, assisting with delivery with administration of fundal pressure, and for overall help during the procedure.  ANESTHESIOLOGY TEAM: Anesthesiologist: Lannie Fields, DO CRNA: Carlos American, CRNA Student Nurse Anesthetist: Jola Schmidt, RN  INDICATIONS: Brianna Acosta is a 31 y.o. [phone] at [redacted]w[redacted]d here for cesarean section secondary to the indications listed under preoperative diagnoses; please see preoperative note for further details.  The risks of surgery were discussed with the patient including but were not limited to: bleeding which may require transfusion or reoperation; infection which may require antibiotics; injury to bowel, bladder, ureters or other surrounding organs; injury to the fetus; need for additional procedures including hysterectomy in the event of a life-threatening hemorrhage; formation of adhesions; placental abnormalities wth subsequent pregnancies; incisional problems; thromboembolic phenomenon and other postoperative/anesthesia complications.  The patient concurred with the proposed plan, giving informed written consent for the procedure.    FINDINGS:  Viable female infant in cephalic presentation.  Apgars 9 and 9.  Clear amniotic fluid.  Intact placenta, three vessel cord.  Normal uterus.  ANESTHESIA: Spinal INTRAVENOUS FLUIDS: 100 ml   ESTIMATED BLOOD LOSS: 500 ml URINE OUTPUT:  300 ml SPECIMENS: Placenta sent to L&D COMPLICATIONS: None immediate  PROCEDURE IN DETAIL:  The patient  preoperatively received intravenous antibiotics and had sequential compression devices applied to her lower extremities.  She was then taken to the operating room where spinal anesthesia was administered and was found to be adequate. She was then placed in a dorsal supine position with a leftward tilt, and prepped and draped in a sterile manner.  A foley catheter was placed into her bladder and attached to constant gravity.  After an adequate timeout was performed, a Pfannenstiel skin incision was made with scalpel on her preexisting scar and carried through to the underlying layer of fascia. The fascia was incised in the midline, and this incision was extended bilaterally in a blunt fashion.  The underlying rectus muscles were dissected off the fascia superiorly and inferiorly in a blunt fashion. A small part of the fascia over the right rectus was taken using the Mayo scissors. The rectus muscles were separated in the midline and the peritoneum was entered bluntly. The Alexis self-retaining retractor was introduced into the abdominal cavity.  Attention was turned to the lower uterine segment where a low transverse hysterotomy was made with a scalpel and extended bilaterally bluntly.  The infant was successfully delivered, the cord was clamped and cut after one minute, and the infant was handed over to the awaiting neonatology team. Uterine massage was then administered, and the placenta delivered intact with a three-vessel cord. The uterus was then cleared of clots and debris.  The hysterotomy was closed with 0 Monocryl in a running unlocked fashion. The pelvis was cleared of all clot and debris. Hemostasis was confirmed on all surfaces.  The retractor was removed.  The peritoneum was closed with a 2.0 Vicryl running stitch. The fascia was then closed using 0 Vicryl in a running fashion.  The subcutaneous layer was irrigated, re-approximated with 2-0 plain gut interrupted stitches, and the skin was closed with  a  4-0 Vicryl subcuticular stitch. The patient tolerated the procedure well. Sponge, instrument and needle counts were correct x 3.  She was taken to the recovery room in stable condition.   Gerlene Fee, DO OB Fellow, [place] [facility] [place] 03/30/2022, 10:34 AM

## 2022-03-31 ENCOUNTER — Encounter (HOSPITAL_COMMUNITY): Payer: Self-pay | Admitting: Obstetrics & Gynecology

## 2022-03-31 LAB — CBC
HCT: 32.7 % — ABNORMAL LOW (ref 36.0–46.0)
Hemoglobin: 11.6 g/dL — ABNORMAL LOW (ref 12.0–15.0)
MCH: 29.4 pg (ref 26.0–34.0)
MCHC: 35.5 g/dL (ref 30.0–36.0)
MCV: 83 fL (ref 80.0–100.0)
Platelets: 255 10*3/uL (ref 150–400)
RBC: 3.94 MIL/uL (ref 3.87–5.11)
RDW: 12.9 % (ref 11.5–15.5)
WBC: 17.4 10*3/uL — ABNORMAL HIGH (ref 4.0–10.5)
nRBC: 0 % (ref 0.0–0.2)

## 2022-03-31 LAB — BIRTH TISSUE RECOVERY COLLECTION (PLACENTA DONATION)

## 2022-03-31 MED ORDER — IBUPROFEN 600 MG PO TABS
600.0000 mg | ORAL_TABLET | Freq: Four times a day (QID) | ORAL | Status: DC
  Administered 2022-03-31 – 2022-04-01 (×5): 600 mg via ORAL
  Filled 2022-03-31 (×6): qty 1

## 2022-03-31 NOTE — Progress Notes (Signed)
POSTPARTUM PROGRESS NOTE  POD #1  Subjective:  Brianna Acosta is a 31 y.o. [id number] s/p rLTCS at [redacted]w[redacted]d.  She reports she doing well. No acute events overnight. She reports she is doing well. She denies any problems with ambulating, voiding or po intake. Denies nausea or vomiting. She has  passed flatus. Pain is well controlled.  Lochia is minimal.  Objective: Blood pressure (!) 99/58, pulse 77, temperature 98.3 F (36.8 C), temperature source Oral, resp. rate 18, height 5\' 4"  (1.626 m), weight 86.2 kg, last menstrual period 06/27/2021, SpO2 99 %, unknown if currently breastfeeding.  Physical Exam:  General: alert, cooperative and no distress Chest: no respiratory distress Heart:regular rate, distal pulses intact Abdomen: soft, nontender,  Uterine Fundus: firm, appropriately tender DVT Evaluation: No calf swelling or tenderness Extremities: No LE edema Skin: warm, dry; incision clean/dry/intact w/ honeycomb dressing in place  Recent Labs    03/29/22 0913 03/31/22 0431  HGB 14.0 11.6*  HCT 41.8 32.7*    Assessment/Plan: Brianna Acosta is a 31 y.o. [id number] s/p elective rLTCS at [redacted]w[redacted]d .  POD#1 - Doing welll; pain well controlled. H/H appropriate  Routine postpartum care  OOB, ambulated  Lovenox for VTE prophylaxis Anemia: asymptomatic Contraception: Outpt mirena Feeding: Breast  Dispo: Plan for discharge 10/11.   LOS: 1 day   Gerlene Fee, DO OB Fellow, [place] [facility] [place] 03/31/2022, 8:44 AM

## 2022-04-01 MED ORDER — ACETAMINOPHEN 500 MG PO TABS: 1000.0000 mg | ORAL_TABLET | Freq: Four times a day (QID) | ORAL | 0 refills | Status: DC

## 2022-04-01 MED ORDER — OXYCODONE HCL 5 MG PO TABS: 5.0000 mg | ORAL_TABLET | ORAL | 0 refills | Status: DC | PRN

## 2022-04-01 MED ORDER — IBUPROFEN 600 MG PO TABS: 600.0000 mg | ORAL_TABLET | Freq: Four times a day (QID) | ORAL | 0 refills | Status: DC

## 2022-04-01 MED ORDER — COCONUT OIL OIL: 1.0000 | TOPICAL_OIL | 0 refills | Status: DC | PRN

## 2022-04-01 NOTE — Lactation Note (Signed)
This note was copied from a baby's chart. Lactation Consultation Note  Patient Name: Brianna Acosta [id number] Date: 04/01/2022 Reason for consult: Follow-up assessment;Term;Infant weight loss;Nipple pain/trauma (2 % weight loss, baby having a circ. Mom C/O sore nipples, LC offered assess. mom receptive. LC noted intact  abrasions on the face portions of both nipples. LC noted areola edema.) Age:30 hours LC showed mom how to use the reverse pressure ( stretch back ) exercise.  LC recommended - prior to latch - breast massage, hand express, pre-pump if needed and reverse pressure , latch with firm support.  LC reviewed BF D/C teaching and [place] resources after D/C.  Maternal Data - per mom had an oversupply and donated her milk.  Plugged ducts.     Feeding Mother's Current Feeding Choice: Breast Milk  LATCH Score - LC did not assess latch , baby having a circ.       Lactation Tools Discussed/Used  Shells   Interventions Interventions: Breast feeding basics reviewed;Shells;Coconut oil;Education;LC Services brochure  Discharge Discharge Education: Engorgement and breast care Pump: DEBP;Manual;Personal WIC Program: No  Consult Status Consult Status: Complete Date: 04/01/22    Myer Haff 04/01/2022, 10:50 AM

## 2022-04-03 ENCOUNTER — Encounter: Payer: Self-pay | Admitting: Family Medicine

## 2022-04-06 ENCOUNTER — Ambulatory Visit (INDEPENDENT_AMBULATORY_CARE_PROVIDER_SITE_OTHER): Payer: BC Managed Care – PPO

## 2022-04-06 NOTE — Progress Notes (Signed)
   GYNECOLOGY PROGRESS NOTE  History:  31 y.o. [id number] presents to [place] office today for incision check. She reports some sharp, pulling sensation on the left groin area. No recognizable attributable factors. Occurs at random. She reports no incisional issues. No drainage, bleeding, erythema, or pain. She denies HA, vision changes, or RUQ/epigastric pain.   The following portions of the patient's history were reviewed and updated as appropriate: allergies, current medications, past family history, past medical history, past social history, past surgical history and problem list.   Health Maintenance Due  Topic Date Due   COVID-19 Vaccine (4 - Pfizer series) 09/12/2020     Review of Systems:  Pertinent items are noted in HPI.   Objective:  Physical Exam Blood pressure 121/88, pulse 99, resp. rate 16, height $RemoveBe'5\' 5"'eIYKvakJQ$  (1.651 m), weight 180 lb (81.6 kg), last menstrual period 06/27/2021, currently breastfeeding. VS reviewed, nursing note reviewed,  Constitutional: well developed, well nourished, no distress HEENT: normocephalic CV: normal rate Pulm/chest wall: normal effort Breast Exam: deferred Abdomen: soft, non-tender, incision clean, well-approximated, no erythema, no tenderness Neuro: alert and oriented x 3 Skin: warm, dry Psych: affect normal Pelvic exam: deferred  Assessment & Plan:  1. Postpartum care and examination - Initial BP 147/89. Repeat 121/88. Patient asymptomatic. No hx of HBP. Will draw labs today. - Reviewed strict PEC s/s and when to seek care in [place] - Has PP visit scheduled   - CBC - Comp Met (CMET)   No follow-ups on file.   Renee Harder, CNM 3:48 PM

## 2022-04-08 LAB — CBC
HCT: 44 % (ref 35.0–45.0)
Hemoglobin: 14.6 g/dL (ref 11.7–15.5)
MCH: 28.7 pg (ref 27.0–33.0)
MCHC: 33.2 g/dL (ref 32.0–36.0)
MCV: 86.4 fL (ref 80.0–100.0)
MPV: 10 fL (ref 7.5–12.5)
Platelets: 410 10*3/uL — ABNORMAL HIGH (ref 140–400)
RBC: 5.09 10*6/uL (ref 3.80–5.10)
RDW: 12.7 % (ref 11.0–15.0)
WBC: 10.4 10*3/uL (ref 3.8–10.8)

## 2022-04-08 LAB — COMPREHENSIVE METABOLIC PANEL
AG Ratio: 1.4 (calc) (ref 1.0–2.5)
ALT: 37 U/L — ABNORMAL HIGH (ref 6–29)
AST: 21 U/L (ref 10–30)
Albumin: 3.8 g/dL (ref 3.6–5.1)
Alkaline phosphatase (APISO): 101 U/L (ref 31–125)
BUN: 16 mg/dL (ref 7–25)
CO2: 22 mmol/L (ref 20–32)
Calcium: 9.2 mg/dL (ref 8.6–10.2)
Chloride: 106 mmol/L (ref 98–110)
Creat: 0.65 mg/dL (ref 0.50–0.97)
Globulin: 2.7 g/dL (calc) (ref 1.9–3.7)
Glucose, Bld: 79 mg/dL (ref 65–99)
Potassium: 4.3 mmol/L (ref 3.5–5.3)
Sodium: 139 mmol/L (ref 135–146)
Total Bilirubin: 0.5 mg/dL (ref 0.2–1.2)
Total Protein: 6.5 g/dL (ref 6.1–8.1)

## 2022-04-08 LAB — SPECIMEN COMPROMISED

## 2022-04-15 ENCOUNTER — Ambulatory Visit: Payer: BC Managed Care – PPO | Admitting: Medical

## 2022-04-15 ENCOUNTER — Encounter: Payer: Self-pay | Admitting: Family Medicine

## 2022-04-15 ENCOUNTER — Telehealth (INDEPENDENT_AMBULATORY_CARE_PROVIDER_SITE_OTHER): Payer: BC Managed Care – PPO | Admitting: Family Medicine

## 2022-04-15 DIAGNOSIS — R051 Acute cough: Secondary | ICD-10-CM

## 2022-04-15 DIAGNOSIS — R059 Cough, unspecified: Secondary | ICD-10-CM | POA: Insufficient documentation

## 2022-04-15 MED ORDER — BENZONATATE 200 MG PO CAPS: 200.0000 mg | ORAL_CAPSULE | Freq: Two times a day (BID) | ORAL | 0 refills | Status: DC | PRN

## 2022-04-15 NOTE — Progress Notes (Signed)
Brianna Acosta - 31 y.o. female MRN [id number]  Date of birth: August 09, 1990   This visit type was conducted due to national recommendations for restrictions regarding the COVID-19 Pandemic (e.g. social distancing).  This format is felt to be most appropriate for this patient at this time.  All issues noted in this document were discussed and addressed.  No physical exam was performed (except for noted visual exam findings with Video Visits).  I discussed the limitations of evaluation and management by telemedicine and the availability of in person appointments. The patient expressed understanding and agreed to proceed.  I connected withNAME@ on 04/15/22 at  1:10 PM EDT by a video enabled telemedicine application and verified that I am speaking with the correct person using two identifiers.  Present at visit: Brianna Nutting, DO Brianna Acosta   Patient Location: Home [address] [place] [id number]   Provider location:   [facility]  Chief Complaint  Patient presents with   Cough    HPI  Rainy Rothman is a 31 y.o. female who presents via audio/video conferencing for a telehealth visit today.  She has had cough for about 5-6 weeks.  Cough is dry in nature.  Denies congestion, feeling of post nasal drainage, wheezing or shortness of breath.  She had C-section on 10/10 and is currently breast feeding.     ROS:  A comprehensive ROS was completed and negative except as noted per HPI  Past Medical History:  Diagnosis Date   Anemia    Mosaic Turner syndrome    Placenta previa     Past Surgical History:  Procedure Laterality Date   APPENDECTOMY     CESAREAN SECTION     CESAREAN SECTION N/A 03/30/2022   Procedure: CESAREAN SECTION;  Surgeon: Truett Mainland, DO;  Location: [facility] LD ORS;  Service: Obstetrics;  Laterality: N/A;    Family History  Problem Relation Age of Onset   Diabetes Mother    Hyperthyroidism Mother    Hypertension Father    Cancer Maternal Grandfather     Social History    Socioeconomic History   Marital status: Married    Spouse name: Not on file   Number of children: Not on file   Years of education: Not on file   Highest education level: Not on file  Occupational History   Not on file  Tobacco Use   Smoking status: Never    Passive exposure: Never   Smokeless tobacco: Never  Vaping Use   Vaping Use: Never used  Substance and Sexual Activity   Alcohol use: Not Currently   Drug use: Never   Sexual activity: Yes    Partners: Male    Birth control/protection: None  Other Topics Concern   Not on file  Social History Narrative   Not on file   Social Determinants of Health   Financial Resource Strain: Not on file  Food Insecurity: No Food Insecurity (03/30/2022)   Hunger Vital Sign    Worried About Running Out of Food in the Last Year: Never true    Ran Out of Food in the Last Year: Never true  Transportation Needs: No Transportation Needs (03/30/2022)   PRAPARE - Hydrologist (Medical): No    Lack of Transportation (Non-Medical): No  Physical Activity: Not on file  Stress: Not on file  Social Connections: Not on file  Intimate Partner Violence: Not At Risk (03/30/2022)   Humiliation, Afraid, Rape, and Kick questionnaire  Fear of Current or Ex-Partner: No    Emotionally Abused: No    Physically Abused: No    Sexually Abused: No     Current Outpatient Medications:    benzonatate (TESSALON) 200 MG capsule, Take 1 capsule (200 mg total) by mouth 2 (two) times daily as needed for cough., Disp: 20 capsule, Rfl: 0   acetaminophen (TYLENOL) 500 MG tablet, Take 2 tablets (1,000 mg total) by mouth every 6 (six) hours., Disp: 30 tablet, Rfl: 0   coconut oil OIL, Apply 1 Application topically as needed., Disp: 30 mL, Rfl: 0   ibuprofen (ADVIL) 600 MG tablet, Take 1 tablet (600 mg total) by mouth every 6 (six) hours., Disp: 30 tablet, Rfl: 0   Loratadine 10 MG CAPS, Take 10 mg by mouth at bedtime., Disp: , Rfl:     Prenatal MV-Min-Fe Fum-FA-DHA (PRENATAL+DHA PO), Take 1 tablet by mouth daily., Disp: , Rfl:   EXAM:  VITALS per patient if applicable: Ht 5\' 5"  (1.651 m)   Wt 180 lb (81.6 kg)   LMP 06/27/2021 (Exact Date)   Breastfeeding Unknown   BMI 29.95 kg/m   GENERAL: alert, oriented, appears well and in no acute distress  HEENT: atraumatic, conjunttiva clear, no obvious abnormalities on inspection of external nose and ears  NECK: normal movements of the head and neck  LUNGS: on inspection no signs of respiratory distress, breathing rate appears normal, no obvious gross SOB, gasping or wheezing  CV: no obvious cyanosis  MS: moves all visible extremities without noticeable abnormality  PSYCH/NEURO: pleasant and cooperative, no obvious depression or anxiety, speech and thought processing grossly intact  ASSESSMENT AND PLAN:  Discussed the following assessment and plan:  Cough Recommend supportive care with increased fluids, humidifier, nasal saline rinses.  Can add delsym and try tessalon.  She is taking claritin.  Can try change to cetirizine to see if this is more effective. Contact clinic for in person visit if not improving with measures outlined above.       I discussed the assessment and treatment plan with the patient. The patient was provided an opportunity to ask questions and all were answered. The patient agreed with the plan and demonstrated an understanding of the instructions.   The patient was advised to call back or seek an in-person evaluation if the symptoms worsen or if the condition fails to improve as anticipated.    08/25/2021, DO

## 2022-04-15 NOTE — Assessment & Plan Note (Signed)
Recommend supportive care with increased fluids, humidifier, nasal saline rinses.  Can add delsym and try tessalon.  She is taking claritin.  Can try change to cetirizine to see if this is more effective. Contact clinic for in person visit if not improving with measures outlined above.

## 2022-04-22 ENCOUNTER — Ambulatory Visit: Payer: BC Managed Care – PPO | Admitting: Family Medicine

## 2022-04-29 ENCOUNTER — Encounter: Payer: Self-pay | Admitting: Family Medicine

## 2022-05-03 ENCOUNTER — Encounter: Payer: Self-pay | Admitting: Family Medicine

## 2022-05-03 ENCOUNTER — Ambulatory Visit (INDEPENDENT_AMBULATORY_CARE_PROVIDER_SITE_OTHER): Payer: BC Managed Care – PPO | Admitting: Family Medicine

## 2022-05-03 ENCOUNTER — Ambulatory Visit: Payer: BC Managed Care – PPO | Admitting: Family Medicine

## 2022-05-03 VITALS — BP 102/71 | HR 100 | Ht 65.0 in | Wt 172.0 lb

## 2022-05-03 DIAGNOSIS — J069 Acute upper respiratory infection, unspecified: Secondary | ICD-10-CM | POA: Diagnosis not present

## 2022-05-03 DIAGNOSIS — J029 Acute pharyngitis, unspecified: Secondary | ICD-10-CM | POA: Diagnosis not present

## 2022-05-03 LAB — POCT RAPID STREP A (OFFICE): Rapid Strep A Screen: NEGATIVE

## 2022-05-03 MED ORDER — BENZONATATE 200 MG PO CAPS: 200.0000 mg | ORAL_CAPSULE | Freq: Two times a day (BID) | ORAL | 0 refills | Status: DC | PRN

## 2022-05-03 NOTE — Patient Instructions (Signed)
-   increase Vit C, D, and Zinc

## 2022-05-03 NOTE — Assessment & Plan Note (Addendum)
-   pleasant 31 yo female who is breastfeeding one month old newborn  - based on symptoms it is viral in nature-recommend immune boosters Vit C, D, and Zinc - recommended rest and fluids

## 2022-05-03 NOTE — Assessment & Plan Note (Addendum)
-   POC strep was negative  - likely viral pharyngitis

## 2022-05-03 NOTE — Progress Notes (Signed)
Acute Office Visit  Subjective:     Patient ID: Brianna Acosta, female    DOB: [birth year], 31 y.o.   MRN: [id number]  Chief Complaint  Patient presents with   Cough   Sore Throat    HPI Patient is in today for sore throat and cough. She said cough has been persistent for 3 weeks. She is currently breastfeeding and has been using OTC delsym. She was also given a prescription of tesslon perles that seemed to work well for her. Two days ago, she started to have a sore throat with pain with swallowing.   Review of Systems  Constitutional:  Negative for chills and fever.  HENT:  Positive for sore throat.   Respiratory:  Negative for cough and shortness of breath.   Cardiovascular:  Negative for chest pain.  Neurological:  Negative for headaches.        Objective:    BP 102/71   Pulse 100   Ht 5\' 5"  (1.651 m)   Wt 172 lb (78 kg)   LMP 06/27/2021 (Exact Date)   SpO2 98%   BMI 28.62 kg/m    Physical Exam Vitals and nursing note reviewed.  Constitutional:      General: She is not in acute distress.    Appearance: Normal appearance.  HENT:     Head: Normocephalic and atraumatic.     Right Ear: Tympanic membrane, ear canal and external ear normal.     Left Ear: Ear canal and external ear normal.     Nose: Nose normal. No congestion.     Mouth/Throat:     Pharynx: Posterior oropharyngeal erythema present.  Eyes:     Conjunctiva/sclera: Conjunctivae normal.  Cardiovascular:     Rate and Rhythm: Normal rate and regular rhythm.  Pulmonary:     Effort: Pulmonary effort is normal.     Breath sounds: Normal breath sounds.  Neurological:     General: No focal deficit present.     Mental Status: She is alert and oriented to person, place, and time.  Psychiatric:        Mood and Affect: Mood normal.        Behavior: Behavior normal.        Thought Content: Thought content normal.        Judgment: Judgment normal.     Results for orders placed or performed in visit on  05/03/22  POCT rapid strep A  Result Value Ref Range   Rapid Strep A Screen Negative Negative        Assessment & Plan:   Problem List Items Addressed This Visit       Respiratory   Viral URI with cough    - pleasant 31 yo female who is breastfeeding one month old newborn  - based on symptoms it is viral in nature-recommend immune boosters Vit C, D, and Zinc - recommended rest and fluids        Other   Sore throat - Primary    - POC strep was negative  - likely viral pharyngitis       Relevant Orders   POCT rapid strep A (Completed)    Meds ordered this encounter  Medications   benzonatate (TESSALON) 200 MG capsule    Sig: Take 1 capsule (200 mg total) by mouth 2 (two) times daily as needed for cough.    Dispense:  20 capsule    Refill:  0    Return if symptoms worsen or fail to  improve.  Charlton Amor, DO

## 2022-05-06 ENCOUNTER — Ambulatory Visit (INDEPENDENT_AMBULATORY_CARE_PROVIDER_SITE_OTHER): Payer: BC Managed Care – PPO | Admitting: Family Medicine

## 2022-05-06 ENCOUNTER — Encounter: Payer: Self-pay | Admitting: Family Medicine

## 2022-05-06 VITALS — BP 108/80 | HR 87 | Temp 98.1°F | Resp 18 | Ht 65.0 in | Wt 172.8 lb

## 2022-05-06 DIAGNOSIS — Q963 Mosaicism, 45, X/46, XX or XY: Secondary | ICD-10-CM | POA: Diagnosis not present

## 2022-05-06 DIAGNOSIS — Z7689 Persons encountering health services in other specified circumstances: Secondary | ICD-10-CM

## 2022-05-06 DIAGNOSIS — J302 Other seasonal allergic rhinitis: Secondary | ICD-10-CM | POA: Diagnosis not present

## 2022-05-06 NOTE — Progress Notes (Signed)
New Patient Office Visit  Subjective    Patient ID: Brianna Acosta, female    DOB: 1990/10/02  Age: 31 y.o. MRN: [id number]  CC:  Chief Complaint  Patient presents with   New Patient (Initial Visit)    HPI Brianna Acosta presents to establish care.  She just had a baby boy on 03/30/22 via planned c-section and is doing really well. Breastfeeding is going well. No concerns today.  She also has a 53-year-old daughter who adjusting well.  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 She followed for cardiology and had a normal echo - states they plan to reassess next year. Asymptomatic.   Patient reports history of significant seasonal allergies that seem to be getting worse. She was on Claritin for awhile, but is now switching to Zyrtec to see if that will make a difference. She is interested in allergy workup.     Outpatient Encounter Medications as of 05/06/2022  Medication Sig   benzonatate (TESSALON) 200 MG capsule Take 1 capsule (200 mg total) by mouth 2 (two) times daily as needed for cough.   coconut oil OIL Apply 1 Application topically as needed.   Loratadine 10 MG CAPS Take 10 mg by mouth at bedtime.   Prenatal MV-Min-Fe Fum-FA-DHA (PRENATAL+DHA PO) Take 1 tablet by mouth daily.   No facility-administered encounter medications on file as of 05/06/2022.    Past Medical History:  Diagnosis Date   Anemia    Mosaic Turner syndrome    Placenta previa     Past Surgical History:  Procedure Laterality Date   APPENDECTOMY     CESAREAN SECTION     CESAREAN SECTION N/A 03/30/2022   Procedure: CESAREAN SECTION;  Surgeon: Levie Heritage, DO;  Location: [facility] LD ORS;  Service: Obstetrics;  Laterality: N/A;    Family History  Problem Relation Age of Onset   Diabetes Mother    Hyperthyroidism Mother    Hypertension Father    Cancer Maternal Grandfather     Social History   Socioeconomic History    Marital status: Married    Spouse name: Not on file   Number of children: Not on file   Years of education: Not on file   Highest education level: Not on file  Occupational History   Not on file  Tobacco Use   Smoking status: Never    Passive exposure: Never   Smokeless tobacco: Never  Vaping Use   Vaping Use: Never used  Substance and Sexual Activity   Alcohol use: Not Currently   Drug use: Never   Sexual activity: Yes    Partners: Male    Birth control/protection: None  Other Topics Concern   Not on file  Social History Narrative   Not on file   Social Determinants of Health   Financial Resource Strain: Not on file  Food Insecurity: No Food Insecurity (03/30/2022)   Hunger Vital Sign    Worried About Running Out of Food in the Last Year: Never true    Ran Out of Food in the Last Year: Never true  Transportation Needs: No Transportation Needs (03/30/2022)   PRAPARE - Administrator, Civil Service (Medical): No    Lack of Transportation (Non-Medical): No  Physical Activity: Not on file  Stress: Not on file  Social Connections: Not on file  Intimate Partner Violence: Not At Risk (03/30/2022)   Humiliation, Afraid, Rape, and  Kick questionnaire    Fear of Current or Ex-Partner: No    Emotionally Abused: No    Physically Abused: No    Sexually Abused: No    ROS All review of systems negative except what is listed in the HPI      Objective    BP 108/80 (BP Location: Left Arm, Patient Position: Sitting, Cuff Size: Normal)   Pulse 87   Temp 98.1 F (36.7 C) (Oral)   Resp 18   Ht 5\' 5"  (1.651 m)   Wt 172 lb 12.8 oz (78.4 kg)   LMP 06/27/2021 (Exact Date)   SpO2 99%   Breastfeeding Yes   BMI 28.76 kg/m   Physical Exam Vitals reviewed.  Constitutional:      Appearance: Normal appearance.  Cardiovascular:     Rate and Rhythm: Normal rate and regular rhythm.     Pulses: Normal pulses.     Heart sounds: Normal heart sounds.  Pulmonary:      Effort: Pulmonary effort is normal.     Breath sounds: Normal breath sounds.  Musculoskeletal:     Cervical back: Normal range of motion.  Skin:    General: Skin is warm and dry.  Neurological:     Mental Status: She is alert and oriented to person, place, and time.  Psychiatric:        Mood and Affect: Mood normal.        Behavior: Behavior normal.        Thought Content: Thought content normal.        Judgment: Judgment normal.            Assessment & Plan:   Problem List Items Addressed This Visit       Other   Seasonal allergies - Primary Continue trial of Zyrtec Can add Flonase, saline spray, humidifier, etc.  Referral for allergy testing   Relevant Orders   Ambulatory referral to Allergy   Other Visit Diagnoses     Encounter to establish care     No acute concerns today.  HM up to date Schedule CPE with labs at her convenience       Return for CPE/labs at your convenience next year.   08/25/2021, NP

## 2022-05-06 NOTE — Patient Instructions (Signed)
Thank you for choosing [facility] at [facility] for your Primary Care needs. I am excited for the opportunity to partner with you to meet your health care goals. It was a pleasure meeting you today!   Information on diet, exercise, and health maintenance recommendations are listed below. This is information to help you be sure you are on track for optimal health and monitoring.   Please look over this and let us know if you have any questions or if you have completed any of the health maintenance outside of [place] so that we can be sure your records are up to date.  ___________________________________________________________  MyChart:  For all urgent or time sensitive needs we ask that you please call the office to avoid delays. Our number is [phone]. MyChart is not constantly monitored and due to the large volume of messages a day, replies may take up to 72 business hours.  MyChart Policy: MyChart allows for you to see your visit notes, after visit summary, provider recommendations, lab and tests results, make an appointment, request refills, and contact your provider or the office for non-urgent questions or concerns. Providers are seeing patients during normal business hours and do not have built in time to review MyChart messages.  We ask that you allow a minimum of 3 business days for responses to MyChart messages. For this reason, please do not send urgent requests through MyChart. Please call the office at [phone]. New and ongoing conditions may require a visit. We have virtual and in-person visits available for your convenience.  Complex MyChart concerns may require a visit. Your provider may request you schedule a virtual or in-person visit to ensure we are providing the best care possible. MyChart messages sent after 11:00 AM on Friday will not be received by the provider until Monday morning.    Lab and Test Results: You will receive your lab and  test results on MyChart as soon as they are completed and results have been sent by the lab or testing facility. Due to this service, you will receive your results BEFORE your provider.  I review lab and test results each morning prior to seeing patients. Some results require collaboration with other providers to ensure you are receiving the most appropriate care. For this reason, we ask that you please allow a minimum of 3-5 business days from the time that ALL results have been received for your provider to receive and review lab and test results and contact you about these.  Most lab and test result comments from the provider will be sent through MyChart. Your provider may recommend changes to the plan of care, follow-up visits, repeat testing, ask questions, or request an office visit to discuss these results. You may reply directly to this message or call the office to provide information for the provider or set up an appointment. In some instances, you will be called with test results and recommendations. Please let us know if this is preferred and we will make note of this in your chart to provide this for you.    If you have not heard a response to your lab or test results in 5 business days from all results returning to MyChart, please call the office to let us know. We ask that you please avoid calling prior to this time unless there is an emergent concern. Due to high call volumes, this can delay the resulting process.  After Hours: For all non-emergency after hours needs,   please call the office at [phone] and select the option to reach the on-call  service. On-call services are shared between multiple [place] offices and therefore it will not be possible to speak directly with your provider. On-call providers may provide medical advice and recommendations, but are unable to provide refills for maintenance medications.  For all emergency or urgent medical needs after normal business  hours, we recommend that you seek care at the closest Urgent Care or Emergency Department to ensure appropriate treatment in a timely manner.  [facility] has a 24 hour emergency room located on the ground floor for your convenience.   Urgent Concerns During the Business Day Providers are seeing patients from 8AM to 5PM with a busy schedule and are most often not able to respond to non-urgent calls until the end of the day or the next business day. If you should have URGENT concerns during the day, please call and speak to the nurse or schedule a same day appointment so that we can address your concern without delay.   Thank you, again, for choosing me as your health care partner. I appreciate your trust and look forward to learning more about you.   Arena Lindahl B. Chayil Gantt, DNP, FNP-C  ___________________________________________________________  Health Maintenance Recommendations Screening Testing Mammogram Every 1-2 years based on history and risk factors Starting at age 50 Pap Smear Ages 21-39 every 3 years Ages 30-65 every 5 years with HPV testing More frequent testing may be required based on results and history Colon Cancer Screening Every 1-10 years based on test performed, risk factors, and history Starting at age 45 Bone Density Screening Every 2-10 years based on history Starting at age 65 for women Recommendations for men differ based on medication usage, history, and risk factors AAA Screening One time ultrasound Men 65-75 years old who have ever smoked Lung Cancer Screening Low Dose Lung CT every 12 months Age 50-80 years with a 20 pack-year smoking history who still smoke or who have quit within the last 15 years  Screening Labs Routine  Labs: Complete Blood Count (CBC), Complete Metabolic Panel (CMP), Cholesterol (Lipid Panel) Every 6-12 months based on history and medications May be recommended more frequently based on current conditions or  previous results Hemoglobin A1c Lab Every 3-12 months based on history and previous results Starting at age 45 or earlier with diagnosis of diabetes, high cholesterol, BMI >26, and/or risk factors Frequent monitoring for patients with diabetes to ensure blood sugar control Thyroid Panel (TSH w/ T3 & T4) Every 6 months based on history, symptoms, and risk factors May be repeated more often if on medication HIV One time testing for all patients 13 and older May be repeated more frequently for patients with increased risk factors or exposure Hepatitis C One time testing for all patients 18 and older May be repeated more frequently for patients with increased risk factors or exposure Gonorrhea, Chlamydia Every 12 months for all sexually active persons 13-24 years Additional monitoring may be recommended for those who are considered high risk or who have symptoms PSA Men 40-54 years old with risk factors Additional screening may be recommended from age 55-69 based on risk factors, symptoms, and history  Vaccine Recommendations Tetanus Booster All adults every 10 years Flu Vaccine All patients 6 months and older every year COVID Vaccine All patients 12 years and older Initial dosing with booster May recommend additional booster based on age and health history HPV Vaccine 2 doses all patients   age 9-26 Dosing may be considered for patients over 26 Shingles Vaccine (Shingrix) 2 doses all adults 50 years and older Pneumonia (Pneumovax 23) All adults 65 years and older May recommend earlier dosing based on health history Pneumonia (Prevnar 13) All adults 65 years and older Dosed 1 year after Pneumovax 23 Pneumonia (Prevnar 20) All adults 65 years and older (adults 19-64 with certain conditions or risk factors) 1 dose  For those who have no received Prevnar 13 vaccine previously   Additional Screening, Testing, and Vaccinations may be recommended on an individualized basis based on  family history, health history, risk factors, and/or exposure.  __________________________________________________________  Diet Recommendations for All Patients  I recommend that all patients maintain a diet low in saturated fats, carbohydrates, and cholesterol. While this can be challenging at first, it is not impossible and small changes can make big differences.  Things to try: Decreasing the amount of soda, sweet tea, and/or juice to one or less per day and replace with water While water is always the first choice, if you do not like water you may consider adding a water additive without sugar to improve the taste other sugar free drinks Replace potatoes with a brightly colored vegetable at dinner Use healthy oils, such as canola oil or olive oil, instead of butter or hard margarine Limit your bread intake to two pieces or less a day Replace regular pasta with low carb pasta options Bake, broil, or grill foods instead of frying Monitor portion sizes  Eat smaller, more frequent meals throughout the day instead of large meals  An important thing to remember is, if you love foods that are not great for your health, you don't have to give them up completely. Instead, allow these foods to be a reward when you have done well. Allowing yourself to still have special treats every once in a while is a nice way to tell yourself thank you for working hard to keep yourself healthy.   Also remember that every day is a new day. If you have a bad day and "fall off the wagon", you can still climb right back up and keep moving along on your journey!  We have resources available to help you!  Some websites that may be helpful include: www.MyPlate.gov  Www.VeryWellFit.com _____________________________________________________________  Activity Recommendations for All Patients  I recommend that all adults get at least 20 minutes of moderate physical activity that elevates your heart rate at least 5  days out of the week.  Some examples include: Walking or jogging at a pace that allows you to carry on a conversation Cycling (stationary bike or outdoors) Water aerobics Yoga Weight lifting Dancing If physical limitations prevent you from putting stress on your joints, exercise in a pool or seated in a chair are excellent options.  Do determine your MAXIMUM heart rate for activity: 220 - YOUR AGE = MAX Heart Rate   Remember! Do not push yourself too hard.  Start slowly and build up your pace, speed, weight, time in exercise, etc.  Allow your body to rest between exercise and get good sleep. You will need more water than normal when you are exerting yourself. Do not wait until you are thirsty to drink. Drink with a purpose of getting in at least 8, 8 ounce glasses of water a day plus more depending on how much you exercise and sweat.    If you begin to develop dizziness, chest pain, abdominal pain, jaw pain, shortness of breath, headache,   vision changes, lightheadedness, or other concerning symptoms, stop the activity and allow your body to rest. If your symptoms are severe, seek emergency evaluation immediately. If your symptoms are concerning, but not severe, please let us know so that we can recommend further evaluation.     

## 2022-05-07 NOTE — Progress Notes (Unsigned)
Post Partum Visit Note  Brianna Acosta is a 31 y.o. G69P2002 female who presents for a postpartum visit. She is 6 weeks postpartum following a repeat cesarean section.  I have fully reviewed the prenatal and intrapartum course. The delivery was at 39.3 gestational weeks.  Anesthesia: spinal. Postpartum course has been unremarkable. Baby is doing well. Baby is feeding by breast. Bleeding no bleeding. Bowel function is normal. Bladder function is normal. Patient is not sexually active. Contraception method is IUD. Postpartum depression screening: negative.   The pregnancy intention screening data noted above was reviewed. Potential methods of contraception were discussed. The patient elected to proceed with No data recorded.   Edinburgh Postnatal Depression Scale - 05/10/22 1307       Edinburgh Postnatal Depression Scale:  In the Past 7 Days   I have been so unhappy that I have been crying. 0             Health Maintenance Due  Topic Date Due   COVID-19 Vaccine (4 - Pfizer series) 09/12/2020    The following portions of the patient's history were reviewed and updated as appropriate: allergies, current medications, past family history, past medical history, past social history, past surgical history, and problem list.  Review of Systems Pertinent items are noted in HPI.  Objective:  BP 113/75   Pulse (!) 101   Ht 5\' 4"  (1.626 m)   Wt 172 lb (78 kg)   BMI 29.52 kg/m    General:  alert, cooperative, and no distress   Breasts:  not indicated  Lungs: Normal effort  Heart:  Regular rate  Abdomen: No tenderness or distension    Wound well approximated incision  GU exam:  normal    GYNECOLOGY OFFICE PROCEDURE NOTE  Brianna Acosta is a 31 y.o. 26 here for IUD insertion. No GYN concerns.  IUD Insertion Procedure Note Procedure: IUD insertion with Mirena UPT: Negative GC/CT testing: Up to date  Patient identified.  Risks, benefits and alternatives of procedure were  discussed including irregular bleeding, cramping, infection, malpositioning or misplacement of the IUD outside the uterus which may require further procedure such as laparoscopy. Also discussed >99% contraception efficacy, increased risk of ectopic pregnancy with failure of method.   Emphasized that this did not protect against STIs, condoms recommended during all sexual encounters. Consent signed. Time out performed.   Speculum inserted and cervix visualized, prepped with 3 swabs of betadine.   Grasped with a single tooth tenaculum. IUD then inserted without difficulty per manufacturer's instructions and strings cut to 3 cm below cervical os and all instruments removed. Pt tolerated well with minimal pain and bleeding.   Discussed concerning signs/symptoms and to call if heavy bleeding, severe abdominal pain, or fever in the following 3 weeks. Manufacturer pamphlet/patient information given. Reviewed timing of efficacy for contraception and to use an alternative form of birth control until that time.  Assessment:   Brianna Acosta was seen today for postpartum care.  Diagnoses and all orders for this visit:  Encounter for IUD insertion -     POCT urine pregnancy -     levonorgestrel (MIRENA) 20 MCG/DAY IUD 1 each  Postpartum care and examination  Cervical cancer screening -     Cytology - PAP     Plan:   Essential components of care per ACOG recommendations:  1.  Mood and well being: Patient with negative depression screening today. Reviewed local resources for support.  - Patient tobacco use? No.   -  hx of drug use? No.    2. Infant care and feeding:  -Patient currently breastmilk feeding? Yes. Reviewed importance of draining breast regularly to support lactation.  -Social determinants of health (SDOH) reviewed in EPIC. No concerns  3. Sexuality, contraception and birth spacing - Patient does not want a pregnancy in the next year.   - Reviewed reproductive life planning. Reviewed  contraceptive methods based on pt preferences and effectiveness.  Patient desired IUD or IUS today.   - Discussed birth spacing of 18 months  4. Sleep and fatigue -Encouraged family/partner/community support of 4 hrs of uninterrupted sleep to help with mood and fatigue  5. Physical Recovery  - Discussed patients delivery and complications. She describes her labor as good. - Patient had a C-section repeat; no problems after deliver. - Patient has urinary incontinence? No. - Patient is safe to resume physical and sexual activity  6.  Health Maintenance - HM due items addressed Yes - Last pap smear No results found for: "DIAGPAP" Pap smear done at today's visit.  -Breast Cancer screening indicated? No.   7. Chronic Disease/Pregnancy Condition follow up: None  - PCP follow up  Milas Hock, MD [facility], [facility]

## 2022-05-10 ENCOUNTER — Other Ambulatory Visit (HOSPITAL_COMMUNITY)
Admission: RE | Admit: 2022-05-10 | Discharge: 2022-05-10 | Disposition: A | Discharge status: Home / Self Care | Payer: BC Managed Care – PPO | Source: Ambulatory Visit | Attending: Obstetrics and Gynecology | Admitting: Obstetrics and Gynecology

## 2022-05-10 ENCOUNTER — Encounter: Payer: Self-pay | Admitting: Obstetrics and Gynecology

## 2022-05-10 ENCOUNTER — Ambulatory Visit (INDEPENDENT_AMBULATORY_CARE_PROVIDER_SITE_OTHER): Payer: BC Managed Care – PPO | Admitting: Obstetrics and Gynecology

## 2022-05-10 VITALS — BP 113/75 | HR 101 | Ht 64.0 in | Wt 172.0 lb

## 2022-05-10 DIAGNOSIS — Z30430 Encounter for insertion of intrauterine contraceptive device: Secondary | ICD-10-CM

## 2022-05-10 DIAGNOSIS — Z124 Encounter for screening for malignant neoplasm of cervix: Secondary | ICD-10-CM | POA: Insufficient documentation

## 2022-05-10 LAB — POCT URINE PREGNANCY: Preg Test, Ur: NEGATIVE

## 2022-05-10 MED ORDER — LEVONORGESTREL 20 MCG/DAY IU IUD
1.0000 | INTRAUTERINE_SYSTEM | Freq: Once | INTRAUTERINE | Status: AC
  Administered 2022-05-10: 1 via INTRAUTERINE

## 2022-05-11 LAB — CYTOLOGY - PAP
Comment: NEGATIVE
Diagnosis: NEGATIVE
High risk HPV: NEGATIVE

## 2022-06-30 NOTE — Progress Notes (Signed)
NEW PATIENT Date of Service/Encounter:  07/02/22 Referring provider: Terrilyn Saver, NP Primary care provider: Terrilyn Saver, NP  Subjective:  Brianna Acosta is a 32 y.o. female presenting today for evaluation of chronic rhinitis and conjunctivitis. History obtained from: chart review and patient.   Chronic rhinitis: started in childhood but worsened after her first pregnancy Symptoms include: nasal congestion, sneezing, watery eyes, and itchy eyes  Occurs year-round with seasonal flares-flares in spring and fall Potential triggers: outdoors Treatments tried: claritin, previously on zyrtec, flonase as needed.  Previous allergy testing: no History of reflux/heartburn: no Previous sinus, ear, tonsil, adenoid surgeries: no Previous sinus imaging: no  Food allergy/intolerance:  Was told she maybe had a gluten intolerance. She was gluten free for a while but then stopped. Was having bloating, headaches.  Certain gluten products will worsen her symptoms more than other items.  Other allergy screening: Asthma: no Medication allergy: no Hymenoptera allergy: no Urticaria: no Eczema:no History of recurrent infections suggestive of immunodeficency: no Vaccinations are up to date.   Past Medical History: Past Medical History:  Diagnosis Date   Anemia    Mosaic Turner syndrome    Placenta previa    Medication List:  Current Outpatient Medications  Medication Sig Dispense Refill   benzonatate (TESSALON) 200 MG capsule Take 1 capsule (200 mg total) by mouth 2 (two) times daily as needed for cough. 20 capsule 0   coconut oil OIL Apply 1 Application topically as needed. 30 mL 0   Loratadine 10 MG CAPS Take 10 mg by mouth at bedtime.     Olopatadine-Mometasone (RYALTRIS) [id number] MCG/ACT SUSP Place 2 sprays into the nose 2 (two) times daily as needed. 29 g 5   Prenatal MV-Min-Fe Fum-FA-DHA (PRENATAL+DHA PO) Take 1 tablet by mouth daily.     No current facility-administered medications for  this visit.   Known Allergies:  No Known Allergies Past Surgical History: Past Surgical History:  Procedure Laterality Date   ADENOIDECTOMY     APPENDECTOMY     CESAREAN SECTION     CESAREAN SECTION N/A 03/30/2022   Procedure: CESAREAN SECTION;  Surgeon: Truett Mainland, DO;  Location: [facility];  Service: Obstetrics;  Laterality: N/A;   Family History: Family History  Problem Relation Age of Onset   Diabetes Mother    Hyperthyroidism Mother    Hypertension Father    Cancer Maternal Grandfather    Social History: [place] lives in a house built 1 year ago, no water damage, carpet in the bedroom, gas and electric heating, central AC, indoor dog, no cockroaches, using dust mite protection on the bedding but not pillows, works as a Geophysical data processor.  + HEPA filter.  [place]   ROS:  All other systems negative except as noted per HPI.  Objective:  Blood pressure 108/64, pulse 83, temperature 98.3 F (36.8 C), temperature source Temporal, resp. rate 17, height 5' 4.5" (1.638 m), weight 168 lb 12.8 oz (76.6 kg), SpO2 99 %, currently breastfeeding. Body mass index is 28.53 kg/m. Physical Exam:  General Appearance:  Alert, cooperative, no distress, appears stated age  Head:  Normocephalic, without obvious abnormality, atraumatic  Eyes:  Conjunctiva clear, EOM's intact  Nose: Nares normal, hypertrophic turbinates, normal mucosa, and no visible anterior polyps  Throat: Lips, tongue normal; teeth and gums normal, normal posterior oropharynx  Neck: Supple, symmetrical  Lungs:   clear to auscultation bilaterally, Respirations unlabored, no coughing  Heart:  regular rate and rhythm  and no murmur, Appears well perfused  Extremities: No edema  Skin: Skin color, texture, turgor normal, no rashes or lesions on visualized portions of skin  Neurologic: No gross deficits   Diagnostics: Skin Testing: Environmental allergy panel and select foods.  Adequate  controls. Results discussed with patient/family.  Airborne Adult Perc - 07/02/22 1446     Time Antigen Placed 1446    Allergen Manufacturer Lavella Hammock    Location Back    Number of Test 58    Panel 1 Select    1. Control-Buffer 50% Glycerol Negative    2. Control-Histamine 1 mg/ml 3+    3. Albumin saline Negative    4. Experiment 3+    5. [place] Negative    6. Johnson 2+    7. Kentucky Blue 3+    9. Perennial Rye 3+    10. Sweet Vernal 3+    11. Timothy 3+    12. Cocklebur Negative    13. Burweed Marshelder Negative    14. Ragweed, short Negative    15. Ragweed, Giant Negative    16. Plantain,  English 3+    17. Lamb's Quarters Negative    18. Sheep Sorrell Negative    19. Rough Pigweed Negative    20. Marsh Elder, Rough Negative    21. Mugwort, Common Negative    22. Ash mix 2+    23. Birch mix Negative    24. Beech American Negative    25. Box, Elder Negative    26. Cedar, red 4+    27. Cottonwood, [place] Negative    28. Elm mix Negative    29. Hickory 3+    30. Maple mix 2+    31. Oak, [place] mix Negative    32. Pecan Pollen 2+    33. Pine mix Negative    34. Sycamore Eastern 2+    35. [place], Black Pollen Negative    36. Alternaria alternata Negative    37. Cladosporium Herbarum Negative    38. Aspergillus mix Negative    39. Penicillium mix Negative    40. Bipolaris sorokiniana (Helminthosporium) Negative    41. Drechslera spicifera (Curvularia) Negative    42. Mucor plumbeus Negative    43. Fusarium moniliforme Negative    44. Aureobasidium pullulans (pullulara) Negative    45. Rhizopus oryzae Negative    46. Botrytis cinera Negative    47. Epicoccum nigrum Negative    48. Phoma betae Negative    49. Candida Albicans Negative    50. Trichophyton mentagrophytes Negative    51. Mite, D Farinae  5,000 AU/ml Negative    52. Mite, D Pteronyssinus  5,000 AU/ml Negative    53. Cat Hair 10,000 BAU/ml 3+    54.  Dog Epithelia Negative    55. Mixed Feathers Negative     56. Horse Epithelia Negative    57. Cockroach, German Negative    58. Mouse Negative    59. Tobacco Leaf Negative             Intradermal - 07/02/22 1541     Time Antigen Placed 1541    Allergen Manufacturer Lavella Hammock    Location Arm    Number of Test 10    Control Negative    Guatem[place] Negative    Johnson Omitted    7 Grass Omitted    Ragweed mix 2+    [employer] Express Omitted    Tree mix Omitted    Mold 1 Negative    Mold  2 2+    Mold 3 Negative    Mold 4 Negative    Cat Omitted    Dog 2+    Cockroach Negative    Mite mix 3+             Food Adult Perc - 07/02/22 1400     Time Antigen Placed 1447    Allergen Manufacturer Waynette Buttery    Location Back    Number of allergen test 3    3. Wheat Negative    30. Barley Negative    32. Rye  Negative            Allergy testing results were read and interpreted by myself, documented by clinical staff.  Assessment and Plan  Chronic Rhinitis: determined to be Seasonal and Perennial Allergic per allergy testing - allergy testing today: positive to grass pollen, ragweed pollen, tree pollen, indoor molds, dust mites, cat, dog  - Prevention:  - allergen avoidance when possible - consider allergy shots as long term control of your symptoms by teaching your immune system to be more tolerant of your allergy triggers  - Symptom control: - Start Ryaltris 1-2 sprays in each nostril twice a day as needed for nasal congestion/itchy nose - Start Antihistamine: daily or daily as needed.   - Allegra (Fexofenadine) 180mg , or Xyzal (Levocetirinze) 5mg  - Can be purchased over-the-counter if not covered by insurance.  Allergic Conjunctivitis:  - Start Allergy Eye drops-great options include Pataday (Olopatadine) or Zaditor (ketotifen) for eye symptoms daily as needed-both sold over the counter if not covered by insurance.  -Avoid eye drops that say red eye relief as they may contain medications that dry out your eyes.  Gluten intolerance:   - allergy testing negative to wheat, barley and rye - suspect intolerance; recommend decreasing quantity of gluten consumption to prevent symptoms - do not require epipen  Follow up : 3 MONTHS, SOONER IF NEEDED  It was a pleasure meeting you in clinic today! Thank you for allowing me to participate in your care.  This note in its entirety was forwarded to the Provider who requested this consultation.  Thank you for your kind referral. I appreciate the opportunity to take part in Cherrise's care. Please do not hesitate to contact me with questions.  Sincerely,  , MD [facility]

## 2022-07-02 ENCOUNTER — Encounter: Payer: Self-pay | Admitting: Internal Medicine

## 2022-07-02 ENCOUNTER — Ambulatory Visit (INDEPENDENT_AMBULATORY_CARE_PROVIDER_SITE_OTHER): Payer: BC Managed Care – PPO | Admitting: Internal Medicine

## 2022-07-02 VITALS — BP 108/64 | HR 83 | Temp 98.3°F | Resp 17 | Ht 64.5 in | Wt 168.8 lb

## 2022-07-02 DIAGNOSIS — H1013 Acute atopic conjunctivitis, bilateral: Secondary | ICD-10-CM | POA: Diagnosis not present

## 2022-07-02 DIAGNOSIS — K9041 Non-celiac gluten sensitivity: Secondary | ICD-10-CM

## 2022-07-02 DIAGNOSIS — T781XXA Other adverse food reactions, not elsewhere classified, initial encounter: Secondary | ICD-10-CM

## 2022-07-02 DIAGNOSIS — K9049 Malabsorption due to intolerance, not elsewhere classified: Secondary | ICD-10-CM

## 2022-07-02 DIAGNOSIS — J3089 Other allergic rhinitis: Secondary | ICD-10-CM

## 2022-07-02 DIAGNOSIS — J310 Chronic rhinitis: Secondary | ICD-10-CM

## 2022-07-02 MED ORDER — RYALTRIS 665-25 MCG/ACT NA SUSP: 2.0000 | Freq: Two times a day (BID) | NASAL | 5 refills | Status: DC | PRN

## 2022-07-02 NOTE — Patient Instructions (Addendum)
Chronic Rhinitis Seasonal and Perennial Allergic: - allergy testing today: positive to grass pollen, ragweed pollen, tree pollen, indoor molds, dust mites, cat, dog  - Prevention:  - allergen avoidance when possible - consider allergy shots as long term control of your symptoms by teaching your immune system to be more tolerant of your allergy triggers  - Symptom control: - Start Ryaltris 1-2 sprays in each nostril twice a day as needed for nasal congestion/itchy nose - Start Antihistamine: daily or daily as needed.   - Allegra (Fexofenadine) 180mg , or Xyzal (Levocetirinze) 5mg  - Can be purchased over-the-counter if not covered by insurance.  Allergic Conjunctivitis:  - Start Allergy Eye drops-great options include Pataday (Olopatadine) or Zaditor (ketotifen) for eye symptoms daily as needed-both sold over the counter if not covered by insurance.  -Avoid eye drops that say red eye relief as they may contain medications that dry out your eyes.  Gluten intolerance:  - allergy testing negative to wheat, barley and rye - suspect intolerance; recommend decreasing quantity of gluten consumption to prevent symptoms - do not require epipen  Follow up : 3 MONTHS, SOONER IF NEEDED  It was a pleasure meeting you in clinic today! Thank you for allowing me to participate in your care.  Sigurd Sos, MD [facility]  Reducing Pollen Exposure  The American Academy of Allergy, Asthma and Immunology suggests the following steps to reduce your exposure to pollen during allergy seasons.    Do not hang sheets or clothing out to dry; pollen may collect on these items. Do not mow lawns or spend time around freshly cut grass; mowing stirs up pollen. Keep windows closed at night.  Keep car windows closed while driving. Minimize morning activities outdoors, a time when pollen counts are usually at their highest. Stay indoors as much as possible when pollen counts or humidity is high and  on windy days when pollen tends to remain in the air longer. Use air conditioning when possible.  Many air conditioners have filters that trap the pollen spores. Use a HEPA room air filter to remove pollen form the indoor air you breathe. Control of Mold Allergen   Mold and fungi can grow on a variety of surfaces provided certain temperature and moisture conditions exist.  Outdoor molds grow on plants, decaying vegetation and soil.  The major outdoor mold, Alternaria and Cladosporium, are found in very high numbers during hot and dry conditions.  Generally, a late Summer - Fall peak is seen for common outdoor fungal spores.  Rain will temporarily lower outdoor mold spore count, but counts rise rapidly when the rainy period ends.  The most important indoor molds are Aspergillus and Penicillium.  Dark, humid and poorly ventilated basements are ideal sites for mold growth.  The next most common sites of mold growth are the bathroom and the kitchen.  Outdoor (Seasonal) Mold Control  Use air conditioning and keep windows closed Avoid exposure to decaying vegetation. Avoid leaf raking. Avoid grain handling. Consider wearing a face mask if working in moldy areas.    Indoor (Perennial) Mold Control   Maintain humidity below 50%. Clean washable surfaces with 5% bleach solution. Remove sources e.g. contaminated carpets.   DUST MITE AVOIDANCE MEASURES:  There are three main measures that need and can be taken to avoid house dust mites:  Reduce accumulation of dust in general -reduce furniture, clothing, carpeting, books, stuffed animals, especially in bedroom  Separate yourself from the dust -use pillow and mattress  encasements (can be found at stores such as [employer] or online) -avoid direct exposure to air condition flow -use a HEPA filter device, especially in the bedroom; you can also use a HEPA filter vacuum cleaner -wipe dust with a moist towel instead of a dry towel or  broom when cleaning  Decrease mites and/or their secretions -wash clothing and linen and stuffed animals at highest temperature possible, at least every 2 weeks -stuffed animals can also be placed in a bag and put in a freezer overnight  Despite the above measures, it is impossible to eliminate dust mites or their allergen completely from your home.  With the above measures the burden of mites in your home can be diminished, with the goal of minimizing your allergic symptoms.  Success will be reached only when implementing and using all means together. Control of Dog or Cat Allergen  Avoidance is the best way to manage a dog or cat allergy. If you have a dog or cat and are allergic to dog or cats, consider removing the dog or cat from the home. If you have a dog or cat but don't want to find it a new home, or if your family wants a pet even though someone in the household is allergic, here are some strategies that may help keep symptoms at bay:  Keep the pet out of your bedroom and restrict it to only a few rooms. Be advised that keeping the dog or cat in only one room will not limit the allergens to that room. Don't pet, hug or kiss the dog or cat; if you do, wash your hands with soap and water. High-efficiency particulate air (HEPA) cleaners run continuously in a bedroom or living room can reduce allergen levels over time. Regular use of a high-efficiency vacuum cleaner or a central vacuum can reduce allergen levels. Giving your dog or cat a bath at least once a week can reduce airborne allergen.

## 2022-10-16 DIAGNOSIS — G8929 Other chronic pain: Secondary | ICD-10-CM | POA: Diagnosis not present

## 2022-10-16 DIAGNOSIS — M5440 Lumbago with sciatica, unspecified side: Secondary | ICD-10-CM | POA: Diagnosis not present

## 2022-10-18 ENCOUNTER — Ambulatory Visit (INDEPENDENT_AMBULATORY_CARE_PROVIDER_SITE_OTHER): Payer: BC Managed Care – PPO | Admitting: Family Medicine

## 2022-10-18 ENCOUNTER — Encounter: Payer: Self-pay | Admitting: Family Medicine

## 2022-10-18 VITALS — BP 99/69 | HR 88 | Temp 98.3°F | Ht 64.5 in | Wt 153.0 lb

## 2022-10-18 DIAGNOSIS — M5440 Lumbago with sciatica, unspecified side: Secondary | ICD-10-CM

## 2022-10-18 DIAGNOSIS — J302 Other seasonal allergic rhinitis: Secondary | ICD-10-CM

## 2022-10-18 DIAGNOSIS — G8929 Other chronic pain: Secondary | ICD-10-CM

## 2022-10-18 DIAGNOSIS — Z131 Encounter for screening for diabetes mellitus: Secondary | ICD-10-CM

## 2022-10-18 DIAGNOSIS — J0140 Acute pansinusitis, unspecified: Secondary | ICD-10-CM | POA: Diagnosis not present

## 2022-10-18 DIAGNOSIS — J3089 Other allergic rhinitis: Secondary | ICD-10-CM | POA: Diagnosis not present

## 2022-10-18 DIAGNOSIS — Z1329 Encounter for screening for other suspected endocrine disorder: Secondary | ICD-10-CM

## 2022-10-18 DIAGNOSIS — Z0000 Encounter for general adult medical examination without abnormal findings: Secondary | ICD-10-CM

## 2022-10-18 DIAGNOSIS — Z0001 Encounter for general adult medical examination with abnormal findings: Secondary | ICD-10-CM

## 2022-10-18 DIAGNOSIS — Z13220 Encounter for screening for lipoid disorders: Secondary | ICD-10-CM | POA: Diagnosis not present

## 2022-10-18 LAB — COMPREHENSIVE METABOLIC PANEL
ALT: 16 U/L (ref 0–35)
AST: 17 U/L (ref 0–37)
Albumin: 4 g/dL (ref 3.5–5.2)
Alkaline Phosphatase: 66 U/L (ref 39–117)
BUN: 15 mg/dL (ref 6–23)
CO2: 27 mEq/L (ref 19–32)
Calcium: 9.1 mg/dL (ref 8.4–10.5)
Chloride: 105 mEq/L (ref 96–112)
Creatinine, Ser: 0.68 mg/dL (ref 0.40–1.20)
GFR: 116.13 mL/min (ref >60.00)
Glucose, Bld: 71 mg/dL (ref 70–99)
Potassium: 3.7 mEq/L (ref 3.5–5.1)
Sodium: 141 mEq/L (ref 135–145)
Total Bilirubin: 1.2 mg/dL (ref 0.2–1.2)
Total Protein: 6.5 g/dL (ref 6.0–8.3)

## 2022-10-18 LAB — CBC WITH DIFFERENTIAL/PLATELET
Basophils Absolute: 0 10*3/uL (ref 0.0–0.1)
Basophils Relative: 0.4 % (ref 0.0–3.0)
Eosinophils Absolute: 0.1 10*3/uL (ref 0.0–0.7)
Eosinophils Relative: 1.5 % (ref 0.0–5.0)
HCT: 38 % (ref 36.0–46.0)
Hemoglobin: 12.8 g/dL (ref 12.0–15.0)
Lymphocytes Relative: 18.9 % (ref 12.0–46.0)
Lymphs Abs: 1.6 10*3/uL (ref 0.7–4.0)
MCHC: 33.8 g/dL (ref 30.0–36.0)
MCV: 87.9 fl (ref 78.0–100.0)
Monocytes Absolute: 0.7 10*3/uL (ref 0.1–1.0)
Monocytes Relative: 8.2 % (ref 3.0–12.0)
Neutro Abs: 6.2 10*3/uL (ref 1.4–7.7)
Neutrophils Relative %: 71 % (ref 43.0–77.0)
Platelets: 348 10*3/uL (ref 150.0–400.0)
RBC: 4.32 Mil/uL (ref 3.87–5.11)
RDW: 12.4 % (ref 11.5–15.5)
WBC: 8.7 10*3/uL (ref 4.0–10.5)

## 2022-10-18 LAB — TSH: TSH: 1.28 u[IU]/mL (ref 0.35–5.50)

## 2022-10-18 LAB — LIPID PANEL
Cholesterol: 142 mg/dL (ref 0–200)
HDL: 57.2 mg/dL (ref >39.00)
LDL Cholesterol: 75 mg/dL (ref 0–99)
NonHDL: 85.07
Total CHOL/HDL Ratio: 2
Triglycerides: 52 mg/dL (ref 0.0–149.0)
VLDL: 10.4 mg/dL (ref 0.0–40.0)

## 2022-10-18 MED ORDER — RYALTRIS 665-25 MCG/ACT NA SUSP: 2.0000 | Freq: Two times a day (BID) | NASAL | 5 refills | Status: DC | PRN

## 2022-10-18 MED ORDER — AMBULATORY NON FORMULARY MEDICATION: 1.0000 | 12 refills | Status: DC

## 2022-10-18 MED ORDER — AMOXICILLIN-POT CLAVULANATE 875-125 MG PO TABS: 1.0000 | ORAL_TABLET | Freq: Two times a day (BID) | ORAL | 0 refills | Status: DC

## 2022-10-18 NOTE — Progress Notes (Signed)
Complete physical exam  Patient: Brianna Acosta   DOB: 04-17-1991   32 y.o. Female  MRN: [id number]  Subjective:    Chief Complaint  Patient presents with   Annual Exam    Brianna Acosta is a 32 y.o. female who presents today for a complete physical exam. She reports consuming a  pescatarian  diet. Home exercise routine includes walking. She generally feels well. She reports sleeping well. She does not have additional problems to discuss today.   Currently lives with: husband and young children Acute concerns or interim problems since last visit: current sinusitis symptoms  - Patient reports she has been struggling with allergies all spring, but about a week ago she started with significant sinus pressure, nasal congestion, purulent mucus, headache. She has been taking her regular allergy medicine without relief. No fevers, chills, body aches, chest pain, dyspnea, wheezing.   Vision concerns: none Dental concerns: none STD concerns: none  Patient denies ETOH use. Patient denies nicotine use. Patient denies illegal substance use.     Females:  She is   sexually active with husband She   concerns today about STI Contraception choices are: IUD- Mirena  LMP: n/a       Most recent fall risk assessment:    10/18/2022   10:18 AM  Fall Risk   Falls in the past year? 0  Number falls in past yr: 0  Injury with Fall? 0  Risk for fall due to : No Fall Risks  Follow up Falls evaluation completed     Most recent depression screenings:    05/03/2022    8:43 AM 09/15/2021   11:11 AM  PHQ 2/9 Scores  PHQ - 2 Score 0 0  PHQ- 9 Score  3             Patient Care Team: Clayborne Dana, NP as PCP - General (Family Medicine) Macon Large, Jethro Bastos, MD as PCP - OBGYN (Obstetrics and Gynecology) Thomasene Ripple, DO as PCP - Cardiology (Cardiology)   Outpatient Medications Prior to Visit  Medication Sig   coconut oil OIL Apply 1 Application topically as needed.   fexofenadine (ALLEGRA)  180 MG tablet Take 180 mg by mouth daily.   Prenatal MV-Min-Fe Fum-FA-DHA (PRENATAL+DHA PO) Take 1 tablet by mouth daily.   [DISCONTINUED] Olopatadine-Mometasone (RYALTRIS) X543819 MCG/ACT SUSP Place 2 sprays into the nose 2 (two) times daily as needed.   [DISCONTINUED] benzonatate (TESSALON) 200 MG capsule Take 1 capsule (200 mg total) by mouth 2 (two) times daily as needed for cough.   [DISCONTINUED] Loratadine 10 MG CAPS Take 10 mg by mouth at bedtime.   No facility-administered medications prior to visit.    ROS All review of systems negative except what is listed in the HPI        Objective:     BP 99/69   Pulse 88   Temp 98.3 F (36.8 C) (Oral)   Ht 5' 4.5" (1.638 m)   Wt 153 lb (69.4 kg)   SpO2 100%   BMI 25.86 kg/m     Physical Exam Vitals reviewed.  Constitutional:      Appearance: Normal appearance.  HENT:     Head: Normocephalic and atraumatic.     Comments: TTP of maxillary/ethmoid sinuses    Right Ear: Tympanic membrane normal.     Left Ear: Tympanic membrane normal.     Nose: Congestion and rhinorrhea present.  Eyes:     Extraocular Movements: Extraocular movements intact.  Conjunctiva/sclera: Conjunctivae normal.  Cardiovascular:     Rate and Rhythm: Normal rate and regular rhythm.     Pulses: Normal pulses.     Heart sounds: Normal heart sounds.  Pulmonary:     Effort: Pulmonary effort is normal.     Breath sounds: Normal breath sounds.  Abdominal:     General: Abdomen is flat. Bowel sounds are normal.     Palpations: Abdomen is soft.  Genitourinary:    Comments: Deferred exam Musculoskeletal:     Cervical back: Normal range of motion and neck supple. No tenderness.     Right lower leg: No edema.     Left lower leg: No edema.  Lymphadenopathy:     Cervical: No cervical adenopathy.  Skin:    General: Skin is warm and dry.     Capillary Refill: Capillary refill takes less than 2 seconds.  Neurological:     General: No focal deficit  present.     Mental Status: She is alert and oriented to person, place, and time. Mental status is at baseline.     Sensory: No sensory deficit.     Motor: No weakness.     Coordination: Coordination normal.     Gait: Gait normal.  Psychiatric:        Mood and Affect: Mood normal.        Behavior: Behavior normal.        Thought Content: Thought content normal.        Judgment: Judgment normal.         No results found for any visits on 10/18/22.      Assessment & Plan:    Routine Health Maintenance and Physical Exam Discussed health promotion and safety including diet and exercise recommendations, dental health, and injury prevention. Tobacco cessation if applicable. Seat belts, sunscreen, smoke detectors, etc.    Immunization History  Administered Date(s) Administered   DTaP 08/13/1991, 10/05/1991, 12/04/1991, 10/06/1992, 09/12/1995   HIB (PRP-OMP) 08/13/1991, 10/05/1991, 12/04/1991, 10/06/1992   HPV Quadrivalent 06/17/2006, 08/16/2006, 12/19/2006   Hepatitis A 05/15/2005, 01/17/2006   Hepatitis B 08/13/1991, 10/05/1991, 12/14/1991   IPV 08/13/1991, 10/05/1991, 12/04/1991, 01/02/1993, 09/18/2014   Influenza,inj,Quad PF,6+ Mos 04/22/2019, 05/21/2021, 03/12/2022   Influenza,inj,quad, With Preservative 05/01/2018   MMR 10/06/1992, 09/12/1995, 04/01/2022   MenQuadfi_Meningococcal Groups ACYW Conjugate 01/19/1994, 01/06/2010   PFIZER(Purple Top)SARS-COV-2 Vaccination 12/06/2019, 12/27/2019, 07/18/2020   Td 01/31/2003, 02/22/2007, 09/18/2014   Tdap 10/17/2018, 01/18/2022   Typhoid Live 09/18/2014   Varicella 01/06/1994, 08/13/2003    Health Maintenance  Topic Date Due   COVID-19 Vaccine (4 - 2023-24 season) 10/15/2023 (Originally 02/19/2022)   INFLUENZA VACCINE  01/20/2023   PAP SMEAR-Modifier  05/10/2025   DTaP/Tdap/Td (11 - Td or Tdap) 01/19/2032   HPV VACCINES  Completed   Hepatitis C Screening  Completed   HIV Screening  Completed        Problem List Items  Addressed This Visit     Seasonal and perennial allergic rhinitis   Relevant Medications   Olopatadine-Mometasone (RYALTRIS) 665-25 MCG/ACT SUSP   Other Visit Diagnoses     Annual physical exam    -  Primary   Relevant Orders   CBC with Differential/Platelet   Comprehensive metabolic panel   Lipid panel   TSH   Lipid screening       Relevant Orders   Lipid panel   Diabetes mellitus screening       Relevant Orders   Comprehensive metabolic panel   Thyroid disorder screen  Relevant Orders   TSH   Chronic bilateral low back pain with sciatica, sciatica laterality unspecified   Continue with home exercises, PRN OTC analgesics.  She believes her insurance will cover massage - she has been getting good relief with monthly massage. Will try ordering.       Relevant Medications   AMBULATORY NON FORMULARY MEDICATION   Acute non-recurrent pansinusitis    Start Augmenting. Continue allergy medicine.  Continue supportive measures including rest, hydration, humidifier use, steam showers, warm compresses to sinuses, warm liquids with lemon and honey, and over-the-counter cough, cold, and analgesics as needed.  Patient aware of signs/symptoms requiring further/urgent evaluation.     Relevant Medications   fexofenadine (ALLEGRA) 180 MG tablet   Olopatadine-Mometasone (RYALTRIS) 665-25 MCG/ACT SUSP   amoxicillin-clavulanate (AUGMENTIN) 875-125 MG tablet      Return in about 1 year (around 10/18/2023) for physical.     Clayborne Dana, NP

## 2022-10-22 ENCOUNTER — Encounter: Payer: Self-pay | Admitting: Family Medicine

## 2022-11-15 DIAGNOSIS — M5440 Lumbago with sciatica, unspecified side: Secondary | ICD-10-CM | POA: Diagnosis not present

## 2022-11-15 DIAGNOSIS — G8929 Other chronic pain: Secondary | ICD-10-CM | POA: Diagnosis not present

## 2022-12-20 ENCOUNTER — Encounter: Payer: Self-pay | Admitting: Family Medicine

## 2022-12-20 DIAGNOSIS — B3789 Other sites of candidiasis: Secondary | ICD-10-CM

## 2022-12-21 MED ORDER — FLUCONAZOLE 200 MG PO TABS: ORAL_TABLET | ORAL | 0 refills | Status: DC

## 2023-03-15 ENCOUNTER — Telehealth: Payer: Self-pay | Admitting: Physician Assistant

## 2023-03-15 ENCOUNTER — Encounter: Payer: Self-pay | Admitting: Family Medicine

## 2023-03-15 DIAGNOSIS — U071 COVID-19: Secondary | ICD-10-CM

## 2023-03-15 MED ORDER — NIRMATRELVIR/RITONAVIR (PAXLOVID)TABLET: 3.0000 | ORAL_TABLET | Freq: Two times a day (BID) | ORAL | 0 refills | Status: AC

## 2023-03-15 MED ORDER — ALBUTEROL SULFATE HFA 108 (90 BASE) MCG/ACT IN AERS: 1.0000 | INHALATION_SPRAY | Freq: Four times a day (QID) | RESPIRATORY_TRACT | 0 refills | Status: DC | PRN

## 2023-03-15 MED ORDER — FLUTICASONE PROPIONATE 50 MCG/ACT NA SUSP: 2.0000 | Freq: Every day | NASAL | 0 refills | Status: DC

## 2023-03-15 NOTE — Patient Instructions (Signed)
Brianna Acosta, thank you for joining Piedad Climes, PA-C for today's virtual visit.  While this provider is not your primary care provider (PCP), if your PCP is located in our provider database this encounter information will be shared with them immediately following your visit.   A [facility] MyChart account gives you access to today's visit and all your visits, tests, and labs performed at [facility] " click here if you don't have a [facility] MyChart account or go to [email or web address]  Consent: (Patient) Brianna Acosta provided verbal consent for this virtual visit at the beginning of the encounter.  Current Medications:  Current Outpatient Medications:    nirmatrelvir/ritonavir (PAXLOVID) 20 x 150 MG & 10 x 100MG  TABS, Take 3 tablets by mouth 2 (two) times daily for 5 days. (Take nirmatrelvir 150 mg two tablets twice daily for 5 days and ritonavir 100 mg one tablet twice daily for 5 days) Patient GFR is 116, Disp: 30 tablet, Rfl: 0   albuterol (VENTOLIN HFA) 108 (90 Base) MCG/ACT inhaler, Inhale 1-2 puffs into the lungs every 6 (six) hours as needed., Disp: 8 g, Rfl: 0   AMBULATORY NON FORMULARY MEDICATION, 1 each by Other route every 30 (thirty) days. 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, Disp: 1 each, Rfl: 12   coconut oil OIL, Apply 1 Application topically as needed., Disp: 30 mL, Rfl: 0   fexofenadine (ALLEGRA) 180 MG tablet, Take 180 mg by mouth daily., Disp: , Rfl:    fluticasone (FLONASE) 50 MCG/ACT nasal spray, Place 2 sprays into both nostrils daily., Disp: 16 g, Rfl: 0   Olopatadine-Mometasone (RYALTRIS) 665-25 MCG/ACT SUSP, Place 2 sprays into the nose 2 (two) times daily as needed., Disp: 29 g, Rfl: 5   Prenatal MV-Min-Fe Fum-FA-DHA (PRENATAL+DHA PO), Take 1 tablet by mouth daily., Disp: , Rfl:    Medications ordered in this encounter:  Meds ordered this encounter  Medications   nirmatrelvir/ritonavir (PAXLOVID) 20 x 150 MG & 10 x  100MG  TABS    Sig: Take 3 tablets by mouth 2 (two) times daily for 5 days. (Take nirmatrelvir 150 mg two tablets twice daily for 5 days and ritonavir 100 mg one tablet twice daily for 5 days) Patient GFR is 116    Dispense:  30 tablet    Refill:  0    Order Specific Question:   Supervising Provider    Answer:   Merrilee Jansky [id number]     *If you need refills on other medications prior to your next appointment, please contact your pharmacy*  Follow-Up: Call back or seek an in-person evaluation if the symptoms worsen or if the condition fails to improve as anticipated.  [facility] [phone]  Care Instructions: Please keep well-hydrated and get plenty of rest. Start a saline nasal rinse to flush out your nasal passages. You can use plain Mucinex to help thin congestion. Only start the Paxlovid if symptoms acutely worsen prior to Friday If you have a humidifier, running in the bedroom at night. I want you to start OTC vitamin D3 1000 units daily, vitamin C 1000 mg daily, and a zinc supplement. Please take prescribed medications as directed.      Isolation Instructions: You are to isolate at home until you have been fever free for at least 24 hours without a fever-reducing medication, and symptoms have been steadily improving for 24 hours. At that time,  you can end isolation but need to mask for an  additional 5 days.   If you must be around other household members who do not have symptoms, you need to make sure that both you and the family members are masking consistently with a high-quality mask.  If you note any worsening of symptoms despite treatment, please seek an in-person evaluation ASAP. If you note any significant shortness of breath or any chest pain, please seek ER evaluation. Please do not delay care!   COVID-19: What to Do if You Are Sick If you test positive and are an older adult or someone who is at high risk of getting very sick from COVID-19,  treatment may be available. Contact a healthcare provider right away after a positive test to determine if you are eligible, even if your symptoms are mild right now. You can also visit a Test to Treat location and, if eligible, receive a prescription from a provider. Don't delay: Treatment must be started within the first few days to be effective. If you have a fever, cough, or other symptoms, you might have COVID-19. Most people have mild illness and are able to recover at home. If you are sick: Keep track of your symptoms. If you have an emergency warning sign (including trouble breathing), call 911. Steps to help prevent the spread of COVID-19 if you are sick If you are sick with COVID-19 or think you might have COVID-19, follow the steps below to care for yourself and to help protect other people in your home and community. Stay home except to get medical care Stay home. Most people with COVID-19 have mild illness and can recover at home without medical care. Do not leave your home, except to get medical care. Do not visit public areas and do not go to places where you are unable to wear a mask. Take care of yourself. Get rest and stay hydrated. Take over-the-counter medicines, such as acetaminophen, to help you feel better. Stay in touch with your doctor. Call before you get medical care. Be sure to get care if you have trouble breathing, or have any other emergency warning signs, or if you think it is an emergency. Avoid public transportation, ride-sharing, or taxis if possible. Get tested If you have symptoms of COVID-19, get tested. While waiting for test results, stay away from others, including staying apart from those living in your household. Get tested as soon as possible after your symptoms start. Treatments may be available for people with COVID-19 who are at risk for becoming very sick. Don't delay: Treatment must be started early to be effective--some treatments must begin within 5  days of your first symptoms. Contact your healthcare provider right away if your test result is positive to determine if you are eligible. Self-tests are one of several options for testing for the virus that causes COVID-19 and may be more convenient than laboratory-based tests and point-of-care tests. Ask your healthcare provider or your local health department if you need help interpreting your test results. You can visit your state, tribal, local, and territorial health department's website to look for the latest local information on testing sites. Separate yourself from other people As much as possible, stay in a specific room and away from other people and pets in your home. If possible, you should use a separate bathroom. If you need to be around other people or animals in or outside of the home, wear a well-fitting mask. Tell your close contacts that they may have been exposed to COVID-19. An infected person can spread  COVID-19 starting 48 hours (or 2 days) before the person has any symptoms or tests positive. By letting your close contacts know they may have been exposed to COVID-19, you are helping to protect everyone. See COVID-19 and Animals if you have questions about pets. If you are diagnosed with COVID-19, someone from the health department may call you. Answer the call to slow the spread. Monitor your symptoms Symptoms of COVID-19 include fever, cough, or other symptoms. Follow care instructions from your healthcare provider and local health department. Your local health authorities may give instructions on checking your symptoms and reporting information. When to seek emergency medical attention Look for emergency warning signs* for COVID-19. If someone is showing any of these signs, seek emergency medical care immediately: Trouble breathing Persistent pain or pressure in the chest New confusion Inability to wake or stay awake Pale, gray, or blue-colored skin, lips, or nail beds,  depending on skin tone *This list is not all possible symptoms. Please call your medical provider for any other symptoms that are severe or concerning to you. Call 911 or call ahead to your local emergency facility: Notify the operator that you are seeking care for someone who has or may have COVID-19. Call ahead before visiting your doctor Call ahead. Many medical visits for routine care are being postponed or done by phone or telemedicine. If you have a medical appointment that cannot be postponed, call your doctor's office, and tell them you have or may have COVID-19. This will help the office protect themselves and other patients. If you are sick, wear a well-fitting mask You should wear a mask if you must be around other people or animals, including pets (even at home). Wear a mask with the best fit, protection, and comfort for you. You don't need to wear the mask if you are alone. If you can't put on a mask (because of trouble breathing, for example), cover your coughs and sneezes in some other way. Try to stay at least 6 feet away from other people. This will help protect the people around you. Masks should not be placed on young children under age 17 years, anyone who has trouble breathing, or anyone who is not able to remove the mask without help. Cover your coughs and sneezes Cover your mouth and nose with a tissue when you cough or sneeze. Throw away used tissues in a lined trash can. Immediately wash your hands with soap and water for at least 20 seconds. If soap and water are not available, clean your hands with an alcohol-based hand sanitizer that contains at least 60% alcohol. Clean your hands often Wash your hands often with soap and water for at least 20 seconds. This is especially important after blowing your nose, coughing, or sneezing; going to the bathroom; and before eating or preparing food. Use hand sanitizer if soap and water are not available. Use an alcohol-based hand  sanitizer with at least 60% alcohol, covering all surfaces of your hands and rubbing them together until they feel dry. Soap and water are the best option, especially if hands are visibly dirty. Avoid touching your eyes, nose, and mouth with unwashed hands. Handwashing Tips Avoid sharing personal household items Do not share dishes, drinking glasses, cups, eating utensils, towels, or bedding with other people in your home. Wash these items thoroughly after using them with soap and water or put in the dishwasher. Clean surfaces in your home regularly Clean and disinfect high-touch surfaces (for example, doorknobs, tables, handles, light  switches, and countertops) in your "sick room" and bathroom. In shared spaces, you should clean and disinfect surfaces and items after each use by the person who is ill. If you are sick and cannot clean, a caregiver or other person should only clean and disinfect the area around you (such as your bedroom and bathroom) on an as needed basis. Your caregiver/other person should wait as long as possible (at least several hours) and wear a mask before entering, cleaning, and disinfecting shared spaces that you use. Clean and disinfect areas that may have blood, stool, or body fluids on them. Use household cleaners and disinfectants. Clean visible dirty surfaces with household cleaners containing soap or detergent. Then, use a household disinfectant. Use a product from [employer] List N: Disinfectants for Coronavirus (COVID-19). Be sure to follow the instructions on the label to ensure safe and effective use of the product. Many products recommend keeping the surface wet with a disinfectant for a certain period of time (look at "contact time" on the product label). You may also need to wear personal protective equipment, such as gloves, depending on the directions on the product label. Immediately after disinfecting, wash your hands with soap and water for 20 seconds. For  completed guidance on cleaning and disinfecting your home, visit Complete Disinfection Guidance. Take steps to improve ventilation at home Improve ventilation (air flow) at home to help prevent from spreading COVID-19 to other people in your household. Clear out COVID-19 virus particles in the air by opening windows, using air filters, and turning on fans in your home. Use this interactive tool to learn how to improve air flow in your home. When you can be around others after being sick with COVID-19 Deciding when you can be around others is different for different situations. Find out when you can safely end home isolation. For any additional questions about your care, contact your healthcare provider or state or local health department. 09/09/2020 Content source: [facility] ([facility]), Division of Viral Diseases This information is not intended to replace advice given to you by your health care provider. Make sure you discuss any questions you have with your health care provider. Document Revised: 10/23/2020 Document Reviewed: 10/23/2020 Elsevier Patient Education  2022 [employer].     If you have been instructed to have an in-person evaluation today at a local Urgent Care facility, please use the link below. It will take you to a list of all of our available [facility], including address, phone number and hours of operation. Please do not delay care.  [facility]  If you or a family member do not have a primary care provider, use the link below to schedule a visit and establish care. When you choose a [facility] primary care physician or advanced practice provider, you gain a long-term partner in health. Find a Primary Care Provider  Learn more about [facility]'s in-office and virtual care options: [facility] - Get Care Now

## 2023-03-15 NOTE — Progress Notes (Signed)
Virtual Visit Consent   Brianna Acosta, you are scheduled for a virtual visit with a [facility] provider today. Just as with appointments in the office, your consent must be obtained to participate. Your consent will be active for this visit and any virtual visit you may have with one of our providers in the next 365 days. If you have a MyChart account, a copy of this consent can be sent to you electronically.  As this is a virtual visit, video technology does not allow for your provider to perform a traditional examination. This may limit your provider's ability to fully assess your condition. If your provider identifies any concerns that need to be evaluated in person or the need to arrange testing (such as labs, EKG, etc.), we will make arrangements to do so. Although advances in technology are sophisticated, we cannot ensure that it will always work on either your end or our end. If the connection with a video visit is poor, the visit may have to be switched to a telephone visit. With either a video or telephone visit, we are not always able to ensure that we have a secure connection.  By engaging in this virtual visit, you consent to the provision of healthcare and authorize for your insurance to be billed (if applicable) for the services provided during this visit. Depending on your insurance coverage, you may receive a charge related to this service.  I need to obtain your verbal consent now. Are you willing to proceed with your visit today? Brianna Acosta has provided verbal consent on 03/15/2023 for a virtual visit (video or telephone). Piedad Climes, [place]  Date: 03/15/2023 2:15 PM  Virtual Visit via Video Note   I, Piedad Climes, connected with  Brianna Acosta  ([id number], Nov 13, 1990) on 03/15/23 at  2:00 PM EDT by a video-enabled telemedicine application and verified that I am speaking with the correct person using two identifiers.  Location: Patient: Virtual Visit Location Patient:  Home Provider: Virtual Visit Location Provider: Home Office   I discussed the limitations of evaluation and management by telemedicine and the availability of in person appointments. The patient expressed understanding and agreed to proceed.    History of Present Illness: Brianna Acosta is a 32 y.o. who identifies as a female who was assigned female at birth, and is being seen today for COVID-19. Was evaluated via EV and started on symptom management with scripts for albuterol and cough medication sent in. Was interested in antiviral medication so submitted video visit. Again symptom onset Sunday -- nasal congestion, cough, headache, sore throat. No fever but occasional chills. Denies chest pain or SOB. Denies GI symptoms. Husband and her little one with COVID currently as well.  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 (Take nirmatrelvir 150 mg two tablets twice daily for 5 days and ritonavir 100 mg one tablet twice daily for 5 days) Patient GFR is 116, Disp: 30 tablet, Rfl: 0  albuterol (VENTOLIN HFA) 108 (90 Base) MCG/ACT inhaler, Inhale 1-2 puffs into the lungs every 6 (six) hours as needed., Disp: 8 g, Rfl: 0   AMBULATORY NON FORMULARY MEDICATION, 1 each by Other route every 30 (thirty) days. 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, Disp: 1 each, Rfl: 12   coconut oil OIL, Apply 1 Application topically as needed., Disp: 30 mL, Rfl: 0    fexofenadine (ALLEGRA) 180 MG tablet, Take 180 mg by mouth daily., Disp: , Rfl:    fluticasone (FLONASE) 50 MCG/ACT nasal spray, Place 2 sprays into both nostrils daily., Disp: 16 g, Rfl: 0   Olopatadine-Mometasone (RYALTRIS) 665-25 MCG/ACT SUSP, Place 2 sprays into the nose 2 (two) times daily as needed., Disp: 29 g, Rfl: 5   Prenatal MV-Min-Fe Fum-FA-DHA (PRENATAL+DHA PO), Take 1 tablet by mouth daily., Disp: , Rfl:   Observations/Objective: Patient is well-developed, well-nourished in no acute distress.  Resting comfortably at home.  Head is normocephalic, atraumatic.  No labored breathing. Speech is clear and coherent with logical content.  Patient is alert and oriented at baseline.   Assessment and Plan: 1. COVID-19 - nirmatrelvir/ritonavir (PAXLOVID) 20 x 150 MG & 10 x 100MG  TABS; Take 3 tablets by mouth 2 (two) times daily for 5 days. (Take nirmatrelvir 150 mg two tablets twice daily for 5 days and ritonavir 100 mg one tablet twice daily for 5 days) Patient GFR is 116  Dispense: 30 tablet; Refill: 0  Discussed I agree with earlier provider that antivirals are not a necessity as she has low risks and only mild-moderate symptoms. She is concerned about deteriorating symptoms. I see per chart review PCP noted could use an antiviral but recommended only if severe symptoms giving no long term data for paxlovid and breastfeeding. Per UTD considered safe but risks/benefits should be weighed. Discussed this with patient and recommend if nothing else since she is adamant about antiviral, can consider delayed start of antiviral only if symptoms worsening in next 48 hours (before end of antiviral window). She agrees to this. Rx sent. Cc'd chart to PCP.  Follow Up Instructions: I discussed the assessment and treatment plan with the patient. The patient was provided an opportunity to ask questions and all were answered. The patient agreed with the plan and demonstrated an understanding of the  instructions.  A copy of instructions were sent to the patient via MyChart unless otherwise noted below.   The patient was advised to call back or seek an in-person evaluation if the symptoms worsen or if the condition fails to improve as anticipated.  Time:  I spent 10 minutes with the patient via telehealth technology discussing the above problems/concerns.    Piedad Climes, PA-C

## 2023-03-15 NOTE — Telephone Encounter (Signed)
I know she would need appt, but not sure if this is even an option with breastfeeding. Please advise.

## 2023-03-15 NOTE — Telephone Encounter (Signed)
There's not much data on Paxlovid during breastfeeding, Thought to be safe, but limited data so it would be a risk/benefit discussion. If her symptoms are tolerable, I would recommend supportive measures instead since she doesn't have significant history/risk factors.

## 2023-03-15 NOTE — Progress Notes (Signed)
E-Visit  for Positive Covid Test Result   We are sorry you are not feeling well. We are here to help!  You have tested positive for COVID-19, meaning that you were infected with the novel coronavirus and could give the virus to others.  Most people with COVID-19 have mild illness and can recover at home without medical care. Do not leave your home, except to get medical care. Do not visit public areas and do not go to places where you are unable to wear a mask. It is important that you stay home  to take care for yourself and to help protect other people in your home and community.      Isolation Instructions:   You are to isolate at home until you have been fever free for at least 24 hours without a fever-reducing medication, and symptoms have been steadily improving for 24 hours. At that time,  you can end isolation but need to mask for an additional 5 days.  If you must be around other household members who do not have symptoms, you need to make sure that both you and the family members are masking consistently with a high-quality mask.  If you note any worsening of symptoms despite treatment, please seek an in-person evaluation ASAP. If you note any significant shortness of breath or any chest pain, please seek ER evaluation. Please do not delay care!   Go to the nearest hospital ED for assessment if fever/cough/breathlessness are severe or illness seems like a threat to life.    The following symptoms may appear 2-14 days after exposure: Fever Cough Shortness of breath or difficulty breathing Chills Repeated shaking with chills Muscle pain Headache Sore throat New loss of taste or smell Fatigue Congestion or runny nose Nausea or vomiting Diarrhea  You can use medication such as  prescription inhaler called Albuterol MDI 90 mcg /actuation 2 puffs every 4 hours as needed for shortness of breath, wheezing, cough and prescription for Fluticasone nasal spray 2 sprays in each  nostril one time per day  OTC cough medications such as Robitussin and Mucinex plus cough drops are safest options for controlling cough with breastfeeding.  You may also take acetaminophen (Tylenol) as needed for fever.  HOME CARE: Only take medications as instructed by your medical team. Drink plenty of fluids and get plenty of rest. A steam or ultrasonic humidifier can help if you have congestion.   GET HELP RIGHT AWAY IF YOU HAVE EMERGENCY WARNING SIGNS.  Call 911 or proceed to your closest emergency facility if: You develop worsening high fever. Trouble breathing Bluish lips or face Persistent pain or pressure in the chest New confusion Inability to wake or stay awake You cough up blood. Your symptoms become more severe Inability to hold down food or fluids  This list is not all possible symptoms. Contact your medical provider for any symptoms that are severe or concerning to you.   Your e-visit answers were reviewed by a board certified advanced clinical practitioner to complete your personal care plan.  Depending on the condition, your plan could have included both over the counter or prescription medications.  If there is a problem please reply once you have received a response from your provider.  Your safety is important to [place].  If you have drug allergies check your prescription carefully.    You can use MyChart to ask questions about today's visit, request a non-urgent call back, or ask for a work or school excuse for  24 hours related to this e-Visit. If it has been greater than 24 hours you will need to follow up with your provider, or enter a new e-Visit to address those concerns. You will get an e-mail in the next two days asking about your experience.  I hope that your e-visit has been valuable and will speed your recovery. Thank you for using e-visits.    I have spent 5 minutes in review of e-visit questionnaire, review and updating patient chart, medical decision  making and response to patient.   Margaretann Loveless, PA-C

## 2023-04-27 DIAGNOSIS — F4322 Adjustment disorder with anxiety: Secondary | ICD-10-CM | POA: Diagnosis not present

## 2023-05-09 DIAGNOSIS — F4322 Adjustment disorder with anxiety: Secondary | ICD-10-CM | POA: Diagnosis not present

## 2023-05-27 ENCOUNTER — Ambulatory Visit (INDEPENDENT_AMBULATORY_CARE_PROVIDER_SITE_OTHER): Payer: BC Managed Care – PPO | Admitting: Obstetrics and Gynecology

## 2023-05-27 ENCOUNTER — Encounter: Payer: Self-pay | Admitting: Obstetrics and Gynecology

## 2023-05-27 VITALS — BP 112/73 | HR 96 | Ht 64.5 in | Wt 152.0 lb

## 2023-05-27 DIAGNOSIS — Z01419 Encounter for gynecological examination (general) (routine) without abnormal findings: Secondary | ICD-10-CM | POA: Diagnosis not present

## 2023-05-27 NOTE — Progress Notes (Signed)
GYNECOLOGY ANNUAL PREVENTATIVE CARE ENCOUNTER NOTE  History:     Brianna Acosta is a 32 y.o. G35P2002 female here for a routine annual gynecologic exam.  Current complaints: None.   Denies abnormal vaginal bleeding, discharge, pelvic pain, problems with intercourse or other gynecologic concerns.    Gynecologic History No LMP recorded. (Menstrual status: IUD). Contraception: IUD Last Pap: 2023. Result was normal with negative HPV Last Mammogram: NA.    Obstetric History OB History  Gravida Para Term Preterm AB Living  2 2 2     2   SAB IAB Ectopic Multiple Live Births        0 2    # Outcome Date GA Lbr Len/2nd Weight Sex Type Anes PTL Lv  2 Term 03/30/22 [redacted]w[redacted]d  3.31 kg M CS-LTranv Spinal  LIV  1 Term 11/25/18     CS-LTranv   LIV    Past Medical History:  Diagnosis Date   Anemia    Mosaic Turner syndrome    Placenta previa     Past Surgical History:  Procedure Laterality Date   ADENOIDECTOMY     APPENDECTOMY     CESAREAN SECTION     CESAREAN SECTION N/A 03/30/2022   Procedure: CESAREAN SECTION;  Surgeon: Levie Heritage, DO;  Location: [facility] LD [facility];  Service: Obstetrics;  Laterality: N/A;    Current Outpatient Medications on File Prior to Visit  Medication Sig Dispense Refill   levonorgestrel (MIRENA) 20 MCG/DAY IUD 1 each by Intrauterine route once.     No current facility-administered medications on file prior to visit.    No Known Allergies  Social History:  reports that she has never smoked. She has never been exposed to tobacco smoke. She has never used smokeless tobacco. She reports that she does not currently use alcohol. She reports that she does not use drugs.  Family History  Problem Relation Age of Onset   Diabetes Mother    Hyperthyroidism Mother    Hypertension Father    Cancer Maternal Grandfather     The following portions of the patient's history were reviewed and updated as appropriate: allergies, current medications, past family history, past  medical history, past social history, past surgical history and problem list.  Review of Systems Pertinent items noted in HPI and remainder of comprehensive ROS otherwise negative.  Physical Exam:  BP 112/73   Pulse 96   Ht 5' 4.5" (1.638 m)   Wt 68.9 kg   BMI 25.69 kg/m  CONSTITUTIONAL: Well-developed, well-nourished female in no acute distress.  HENT:  Normocephalic, atraumatic, External right and left ear normal.  EYES: Conjunctivae and EOM are normal. Pupils are equal, round, and reactive to light. No scleral icterus.  NECK: Normal range of motion, supple, no masses.  Normal thyroid.  SKIN: Skin is warm and dry. No rash noted. Not diaphoretic. No erythema. No pallor. MUSCULOSKELETAL: Normal range of motion. No tenderness.  No cyanosis, clubbing, or edema. NEUROLOGIC: Alert and oriented to person, place, and time. Normal reflexes, muscle tone coordination.  PSYCHIATRIC: Normal mood and affect. Normal behavior. Normal judgment and thought content. CARDIOVASCULAR: Normal heart rate noted, regular rhythm RESPIRATORY: Clear to auscultation bilaterally. Effort and breath sounds normal, no problems with respiration noted. BREASTS: Symmetric in size. No masses, tenderness, skin changes, nipple drainage, or lymphadenopathy bilaterally. Performed in the presence of a chaperone. ABDOMEN: Soft, no distention noted.  No tenderness, rebound or guarding.  PELVIC: Normal appearing external genitalia and urethral meatus; normal appearing vaginal  mucosa and cervix.  No abnormal vaginal discharge noted.  Bimanual exam done.  Normal uterine size, no other palpable masses, no uterine or adnexal tenderness. IUD strings palpated.  Performed in the presence of a chaperone.   Assessment and Plan:   1. Women's annual routine gynecological examination  Pap not due Mammo's at age 13   Will follow up results of pap smear and manage accordingly. Normal breast examination today, she was advised to perform  periodic self breast examinations.  Routine preventative health maintenance measures emphasized. Please refer to After Visit Summary for other counseling recommendations.    Brianna Acosta, Brianna Rutherford, NP [facility] [employer], [facility]

## 2023-07-12 ENCOUNTER — Ambulatory Visit (INDEPENDENT_AMBULATORY_CARE_PROVIDER_SITE_OTHER): Payer: BC Managed Care – PPO | Admitting: Medical

## 2023-07-12 ENCOUNTER — Encounter: Payer: Self-pay | Admitting: Medical

## 2023-07-12 VITALS — BP 107/65 | HR 100 | Temp 99.9°F | Resp 18 | Ht 64.5 in | Wt 153.6 lb

## 2023-07-12 DIAGNOSIS — J0110 Acute frontal sinusitis, unspecified: Secondary | ICD-10-CM

## 2023-07-12 DIAGNOSIS — R509 Fever, unspecified: Secondary | ICD-10-CM

## 2023-07-12 LAB — POC COVID19 BINAXNOW: SARS Coronavirus 2 Ag: NEGATIVE

## 2023-07-12 MED ORDER — FLUTICASONE PROPIONATE 50 MCG/ACT NA SUSP: 2.0000 | Freq: Every day | NASAL | 1 refills | Status: AC

## 2023-07-12 MED ORDER — AMOXICILLIN-POT CLAVULANATE 875-125 MG PO TABS: 1.0000 | ORAL_TABLET | Freq: Two times a day (BID) | ORAL | 0 refills | Status: DC

## 2023-07-12 MED ORDER — BENZONATATE 100 MG PO CAPS: 100.0000 mg | ORAL_CAPSULE | Freq: Three times a day (TID) | ORAL | 0 refills | Status: DC | PRN

## 2023-07-12 NOTE — Patient Instructions (Signed)
Acute Sinusitis Rapid onset of symptoms including frontal, ethmoid, and maxillary sinus tenderness, left submandibular lymph node enlargement, and left ear dullness. -Prescribe Augmentin twice daily for 10 days. -Prescribe Flonase nasal spray for congestion. -Follow up in 10-14 days if residual symptoms persist or if symptoms worsen or change.  Breastfeeding Patient is currently breastfeeding. -Cancelled prescription for benzonatate due to potential risks with breastfeeding. -If cough develops, patient to refer to safe medication list and contact office if severe.  Intrauterine Device (IUD) Patient has a Mirena IUD and has not had a menstrual cycle since its placement. -No changes or concerns noted at this time.   Follow up in 10-14 days or sooner if needed

## 2023-07-12 NOTE — Progress Notes (Unsigned)
Subjective:    Patient ID: Brianna Acosta, female    DOB: 30-May-1991, 33 y.o.   MRN: [id number]  HPI   Discussed the use of AI scribe software for clinical note transcription with the patient, who gave verbal consent to proceed.  History of Present Illness   The patient, with a history of sinusitis and an IUD in place, presents with symptoms suggestive of a sinus infection. She reports a two-day history of illness, with symptoms intensifying on the second day. Initially, she experienced a runny nose and an itchy throat, which did not cause pain on swallowing. However, the following day, she developed a frontal sinus region headache and felt generally unwell.  The patient describes pain in the sinus area, specifically in the front sinus, the bridge of the nose ethmoid sinus area , and some maxillary region pain(along with tmj area). She also reports some ear pain when blowing her nose. She experienced chills and had difficulty staying warm, but did not report any fever, wheezing, or shortness of breath.  The patient denies any significant coughing. She also reports an irregular menstrual cycle since the placement of her IUD, with the last normal cycle occurring before the IUD was inserted.  The patient's symptoms, including frontal, ethmoid, and maxillary sinus tenderness, a dull and pinkish left ear, and an enlarged and tender left submandibular node, suggest a sinus infection.       Covid test was negative. Ran by MA. Offered pt to do flu test and she declined.(Note did not think indicated)  Review of Systems  Constitutional:  Positive for chills. Negative for fatigue and fever.  HENT:  Positive for congestion, ear pain, sinus pressure and sinus pain. Negative for postnasal drip, sore throat and trouble swallowing.   Respiratory:  Negative for cough and wheezing.   Cardiovascular:  Negative for chest pain and palpitations.  Gastrointestinal:  Negative for abdominal pain, constipation and  nausea.  Musculoskeletal:  Negative for back pain.  Skin:  Negative for rash.  Neurological:  Negative for dizziness, light-headedness and numbness.  Hematological:  Positive for adenopathy. Does not bruise/bleed easily.  Psychiatric/Behavioral:  Negative for behavioral problems and confusion.     Past Medical History:  Diagnosis Date   Anemia    Mosaic Turner syndrome    Placenta previa      Social History   Socioeconomic History   Marital status: Married    Spouse name: Not on file   Number of children: Not on file   Years of education: Not on file   Highest education level: Bachelor's degree (e.g., BA, AB, BS)  Occupational History   Not on file  Tobacco Use   Smoking status: Never    Passive exposure: Never   Smokeless tobacco: Never  Vaping Use   Vaping status: Never Used  Substance and Sexual Activity   Alcohol use: Not Currently   Drug use: Never   Sexual activity: Yes    Partners: Male    Birth control/protection: I.U.D.  Other Topics Concern   Not on file  Social History Narrative   Not on file   Social Drivers of Health   Financial Resource Strain: Low Risk  (07/11/2023)   Overall Financial Resource Strain (CARDIA)    Difficulty of Paying Living Expenses: Not hard at all  Food Insecurity: No Food Insecurity (07/11/2023)   Hunger Vital Sign    Worried About Running Out of Food in the Last Year: Never true    Ran Out of  Food in the Last Year: Never true  Transportation Needs: No Transportation Needs (07/11/2023)   PRAPARE - Administrator, Civil Service (Medical): No    Lack of Transportation (Non-Medical): No  Physical Activity: Sufficiently Active (07/11/2023)   Exercise Vital Sign    Days of Exercise per Week: 4 days    Minutes of Exercise per Session: 50 min  Stress: No Stress Concern Present (07/11/2023)   [employer] of Occupational Health - Occupational Stress Questionnaire    Feeling of Stress : Not at all  Social Connections:  Socially Integrated (07/11/2023)   Social Connection and Isolation Panel [NHANES]    Frequency of Communication with Friends and Family: More than three times a week    Frequency of Social Gatherings with Friends and Family: Once a week    Attends Religious Services: More than 4 times per year    Active Member of [employer] or Organizations: Yes    Attends Banker Meetings: More than 4 times per year    Marital Status: Married  Catering manager Violence: Not At Risk (03/30/2022)   Humiliation, Afraid, Rape, and Kick questionnaire    Fear of Current or Ex-Partner: No    Emotionally Abused: No    Physically Abused: No    Sexually Abused: No    Past Surgical History:  Procedure Laterality Date   ADENOIDECTOMY     APPENDECTOMY     CESAREAN SECTION     CESAREAN SECTION N/A 03/30/2022   Procedure: CESAREAN SECTION;  Surgeon: Levie Heritage, DO;  Location: [facility] LD ORS;  Service: Obstetrics;  Laterality: N/A;    Family History  Problem Relation Age of Onset   Diabetes Mother    Hyperthyroidism Mother    Hypertension Father    Cancer Maternal Grandfather     No Known Allergies  Current Outpatient Medications on File Prior to Visit  Medication Sig Dispense Refill   levonorgestrel (MIRENA) 20 MCG/DAY IUD 1 each by Intrauterine route once.     No current facility-administered medications on file prior to visit.    BP 107/65   Pulse 100   Temp 99.9 F (37.7 C)   Resp 18   Ht 5' 4.5" (1.638 m)   Wt 153 lb 9.6 oz (69.7 kg)   SpO2 100%   BMI 25.96 kg/m        Objective:   Physical Exam  General- No acute distress. Pleasant patient. Neck- Full range of motion, no jvd Lungs- Clear, even and unlabored. Heart- regular rate and rhythm. Neurologic- CNII- XII grossly intact.  HEENT: Left ear exhibits dull, pinkish discoloration. Frontal sinuses, ethmoid, and maxillary sinus tenderness, mostly tender in the frontal. NECK: Left submandibular node slightly enlarged and  tender.     Assessment & Plan:   Assessment and Plan    Acute Sinusitis Rapid onset of symptoms including frontal, ethmoid, and maxillary sinus tenderness, left submandibular lymph node enlargement, and left ear dullness. -Prescribe Augmentin twice daily for 10 days. -Prescribe Flonase nasal spray for congestion. -Follow up in 10-14 days if residual symptoms persist or if symptoms worsen or change.  Breastfeeding Patient is currently breastfeeding. -Cancelled prescription for benzonatate due to potential risks with breastfeeding. -If cough develops, patient to refer to safe medication list and contact office if severe.  Intrauterine Device (IUD) Patient has a Mirena IUD and has not had a menstrual cycle since its placement. -No changes or concerns noted at this time.   Follow up in 10-14  days or sooner if needed   [employer], [employer]

## 2023-07-13 ENCOUNTER — Encounter: Payer: Self-pay | Admitting: Medical

## 2023-09-09 ENCOUNTER — Ambulatory Visit: Admitting: Medical

## 2023-09-09 VITALS — BP 100/70 | HR 98 | Temp 98.5°F | Resp 18 | Ht 64.5 in | Wt 152.0 lb

## 2023-09-09 DIAGNOSIS — J029 Acute pharyngitis, unspecified: Secondary | ICD-10-CM

## 2023-09-09 DIAGNOSIS — R0981 Nasal congestion: Secondary | ICD-10-CM

## 2023-09-09 LAB — POCT RAPID STREP A (OFFICE): Rapid Strep A Screen: NEGATIVE

## 2023-09-09 LAB — POCT INFLUENZA A/B
Influenza A, POC: NEGATIVE
Influenza B, POC: NEGATIVE

## 2023-09-09 LAB — POC COVID19 BINAXNOW: SARS Coronavirus 2 Ag: NEGATIVE

## 2023-09-09 MED ORDER — AMOXICILLIN-POT CLAVULANATE 875-125 MG PO TABS: 1.0000 | ORAL_TABLET | Freq: Two times a day (BID) | ORAL | 0 refills | Status: DC

## 2023-09-09 NOTE — Patient Instructions (Signed)
 Allergic Rhinitis   Chronic allergic rhinitis with spring exacerbation. Current treatment insufficient. Ryaltris effective but not on presently. Considering Medrol Dosepak if symptoms persist and allergist does not respond to refill request. Currently continue flonase and allegra.  -get opinion from allergist if can use Ryaltris while breast feeding. - Prescribe Augmentin 875 mg BID for 10 days for potential sinus infection.   - Contact allergist to refill Ryaltris nasal spray to see if can use it instead of Flonase.   - Discuss potential Medrol Dosepak use if symptoms persist and allergist does not respond today, If had to add on medrol  six-day breastfeeding cessation would be recommended.  - Recommend saline nasal irrigation, especially 12 hours post-nasal spray.   - Advise lozenges for throat moisture.   - Monitor symptoms and report persistent sinus pain after 7 days for potential rocephin injection.    Sinusitis   Possible sinusitis due to persistent sinus pressure and recurrent infections. Augmentin previously effective and safe during breastfeeding.   - Prescribe Augmentin 875 mg BID for 10 days.   - Monitor symptoms and report persistent sinus pain after 7 days for potential Rocephin injection.    Follow-up   Follow-up required to assess treatment response and adjust management. Emphasized timely allergist communication for effective symptom management.   - Contact office if symptoms persist or worsen.   - Call allergist for Ryaltris refill and report if unresponsive.    follow up 7 days or sooner if signs/symptoms persist or worsen

## 2023-09-09 NOTE — Progress Notes (Signed)
 Subjective:    Patient ID: Brianna Acosta, female    DOB: [birth year], 33 y.o.   MRN: [id number]  HPI  Brianna Acosta is a 33 year old female with chronic seasonal allergies who presents with nasal congestion and sinus pressure. ST as well but mild.  She has been experiencing nasal congestion and sinus pressure, which began with a stuffy, runny nose on Monday. The symptoms progressed from a runny nose to more pronounced congestion. She also has a scratchy throat and itchy eyes. No body aches, fevers, chills, or sweats are present.  She has a history of chronic seasonal allergies, which tend to worsen in the spring. Her current allergy management includes Allegra once daily and Flonase. She previously used Ryaltris nasal spray effectively during the spring but has not been able to refill it recently.  She has experienced sinus infections in the past, with the last occurrence a few months ago, for which she was treated with Augmentin. Today tests for strep, flu, and COVID were negative.  She is currently breastfeeding her 39-month-old child, who also takes a bottle.      Review of Systems  Constitutional:  Negative for chills, fatigue and fever.  HENT:  Positive for congestion, postnasal drip, sinus pressure, sinus pain and sore throat. Negative for ear pain and mouth sores.   Eyes:  Positive for itching.  Respiratory:  Negative for cough, chest tightness and wheezing.   Cardiovascular:  Negative for chest pain and palpitations.  Gastrointestinal:  Negative for abdominal pain, blood in stool and diarrhea.  Musculoskeletal:  Negative for arthralgias, back pain, myalgias and neck stiffness.  Psychiatric/Behavioral:  Negative for behavioral problems and dysphoric mood.      Past Medical History:  Diagnosis Date   Anemia    Mosaic Turner syndrome    Placenta previa      Social History   Socioeconomic History   Marital status: Married    Spouse name: Not on file   Number of children: Not  on file   Years of education: Not on file   Highest education level: Bachelor's degree (e.g., BA, AB, BS)  Occupational History   Not on file  Tobacco Use   Smoking status: Never    Passive exposure: Never   Smokeless tobacco: Never  Vaping Use   Vaping status: Never Used  Substance and Sexual Activity   Alcohol use: Not Currently   Drug use: Never   Sexual activity: Yes    Partners: Male    Birth control/protection: I.U.D.  Other Topics Concern   Not on file  Social History Narrative   Not on file   Social Drivers of Health   Financial Resource Strain: Low Risk  (07/11/2023)   Overall Financial Resource Strain (CARDIA)    Difficulty of Paying Living Expenses: Not hard at all  Food Insecurity: No Food Insecurity (07/11/2023)   Hunger Vital Sign    Worried About Running Out of Food in the Last Year: Never true    Ran Out of Food in the Last Year: Never true  Transportation Needs: No Transportation Needs (07/11/2023)   PRAPARE - Administrator, Civil Service (Medical): No    Lack of Transportation (Non-Medical): No  Physical Activity: Sufficiently Active (07/11/2023)   Exercise Vital Sign    Days of Exercise per Week: 4 days    Minutes of Exercise per Session: 50 min  Stress: No Stress Concern Present (07/11/2023)   [employer] of Occupational Health - Occupational  Stress Questionnaire    Feeling of Stress : Not at all  Social Connections: Socially Integrated (07/11/2023)   Social Connection and Isolation Panel [NHANES]    Frequency of Communication with Friends and Family: More than three times a week    Frequency of Social Gatherings with Friends and Family: Once a week    Attends Religious Services: More than 4 times per year    Active Member of [employer] or Organizations: Yes    Attends Banker Meetings: More than 4 times per year    Marital Status: Married  Catering manager Violence: Not At Risk (03/30/2022)   Humiliation, Afraid, Rape, and  Kick questionnaire    Fear of Current or Ex-Partner: No    Emotionally Abused: No    Physically Abused: No    Sexually Abused: No    Past Surgical History:  Procedure Laterality Date   ADENOIDECTOMY     APPENDECTOMY     CESAREAN SECTION     CESAREAN SECTION N/A 03/30/2022   Procedure: CESAREAN SECTION;  Surgeon: Levie Heritage, DO;  Location: [facility];  Service: Obstetrics;  Laterality: N/A;    Family History  Problem Relation Age of Onset   Diabetes Mother    Hyperthyroidism Mother    Hypertension Father    Cancer Maternal Grandfather     No Known Allergies  Current Outpatient Medications on File Prior to Visit  Medication Sig Dispense Refill   Fexofenadine HCl (ALLEGRA PO) Take by mouth.     amoxicillin-clavulanate (AUGMENTIN) 875-125 MG tablet Take 1 tablet by mouth 2 (two) times daily. 20 tablet 0   benzonatate (TESSALON) 100 MG capsule Take 1 capsule (100 mg total) by mouth 3 (three) times daily as needed for cough. 30 capsule 0   fluticasone (FLONASE) 50 MCG/ACT nasal spray Place 2 sprays into both nostrils daily. 16 g 1   levonorgestrel (MIRENA) 20 MCG/DAY IUD 1 each by Intrauterine route once.     No current facility-administered medications on file prior to visit.    BP 100/70   Pulse 98   Temp 98.5 F (36.9 C)   Resp 18   Ht 5' 4.5" (1.638 m)   Wt 152 lb (68.9 kg)   SpO2 98%   Breastfeeding Yes   BMI 25.69 kg/m        Objective:   Physical Exam  General Mental Status- Alert. General Appearance- Not in acute distress.   Skin General: Color- Normal Color. Moisture- Normal Moisture.  Neck  No JVD.   Chest and Lung Exam Auscultation: Breath Sounds:-CTA  Cardiovascular Auscultation:Rythm- RRR Murmurs & Other Heart Sounds:Auscultation of the heart reveals- No Murmurs.  Abdomen Inspection:-Inspeection Normal. Palpation/Percussion:Note:No mass. Palpation and Percussion of the abdomen reveal- Non Tender, Non Distended + BS, no rebound or  guarding.   Neurologic Cranial Nerve exam:- CN III-XII intact(No nystagmus), symmetric smile. Strength:- 5/5 equal and symmetric strength both upper and lower extremities.       Assessment & Plan:   Allergic Rhinitis   Chronic allergic rhinitis with spring exacerbation. Current treatment insufficient. Ryaltris effective but not on presently. Considering Medrol Dosepak if symptoms persist and allergist does not respond to refill request. Currently continue flonase and allegra.  -get opinion from allergist if can use Ryaltris while breast feeding. - Prescribe Augmentin 875 mg BID for 10 days for potential sinus infection.   - Contact allergist to refill Ryaltris nasal spray to see if can use it instead of Flonase.   -  Discuss potential Medrol Dosepak use if symptoms persist and allergist does not respond today, If had to add on medrol  six-day breastfeeding cessation would be recommended.  - Recommend saline nasal irrigation, especially 12 hours post-nasal spray.   - Advise lozenges for throat moisture.   - Monitor symptoms and report persistent sinus pain after 7 days for potential rocephin injection.    Sinusitis   Possible sinusitis due to persistent sinus pressure and recurrent infections. Augmentin previously effective and safe during breastfeeding.   - Prescribe Augmentin 875 mg BID for 10 days.   - Monitor symptoms and report persistent sinus pain after 7 days for potential Rocephin injection.    Follow-up   Follow-up required to assess treatment response and adjust management. Emphasized timely allergist communication for effective symptom management.   - Contact office if symptoms persist or worsen.   - Call allergist for Ryaltris refill and report if unresponsive.    follow up 7 days or sooner if signs/symptoms persist or worsen   Esperanza Richters, PA-C

## 2023-09-19 ENCOUNTER — Encounter: Payer: Self-pay | Admitting: Family Medicine

## 2024-02-17 ENCOUNTER — Ambulatory Visit: Payer: Self-pay | Admitting: Student

## 2024-02-17 ENCOUNTER — Encounter: Payer: Self-pay | Admitting: Student

## 2024-02-17 ENCOUNTER — Ambulatory Visit (HOSPITAL_BASED_OUTPATIENT_CLINIC_OR_DEPARTMENT_OTHER)
Admission: RE | Admit: 2024-02-17 | Discharge: 2024-02-17 | Disposition: A | Discharge status: Home / Self Care | Source: Ambulatory Visit | Attending: Student | Admitting: Student

## 2024-02-17 ENCOUNTER — Ambulatory Visit (INDEPENDENT_AMBULATORY_CARE_PROVIDER_SITE_OTHER): Admitting: Student

## 2024-02-17 VITALS — BP 122/78 | HR 64 | Resp 16 | Ht 64.5 in | Wt 160.2 lb

## 2024-02-17 DIAGNOSIS — S99912A Unspecified injury of left ankle, initial encounter: Secondary | ICD-10-CM | POA: Diagnosis not present

## 2024-02-17 DIAGNOSIS — M25572 Pain in left ankle and joints of left foot: Secondary | ICD-10-CM

## 2024-02-17 NOTE — Progress Notes (Signed)
   Acute Office Visit  Subjective:     Patient ID: Brianna Acosta, female    DOB: 23-Jul-1990, 33 y.o.   MRN: [id number]  Chief Complaint  Patient presents with   Acute Visit    Patient presents today for left ankle pain for about 4-5 days now. She reports icing the ankle and denies taking any pain medication. She reports hearing a popping sound recently and was concerned.    HPI Patient presents with acute left ankle pain. Four days ago, she landed awkwardly on her left ankle during Pilates and later felt a "pop." Since the injury, she reports sharp, intense pain with walking. Pain is rated 5/10 at rest and 9/10 with ambulation.   Patient denies fever, chills, SOB, CP, palpitations, dyspnea, edema, HA, vision changes, N/V/D, abdominal pain, urinary symptoms, rash, weight changes, and recent illness or hospitalizations.   ROS See HPI     Objective:    BP 122/78   Pulse 64   Resp 16   Ht 5' 4.5 (1.638 m)   Wt 160 lb 3.2 oz (72.7 kg)   SpO2 99%   Breastfeeding Yes   BMI 27.07 kg/m    Physical Exam  General: No acute distress. Awake and conversant.  Respiratory: CTAB. Respirations are non-labored. No wheezing.  Skin: Warm. No rashes or ulcers.  Psych: Alert and oriented. Cooperative, Appropriate mood and affect, Normal judgment.  CV: RRR. No murmur. No lower extremity edema.  MSK: Normal ambulation. No clubbing or cyanosis.  Left ankle: +TWP over left lateral malleolus. Full ROM, but reports sharp pain with flexion and external rotation. No visible erythema, bruising, or swelling. Neuro:  CN II-XII grossly normal.     No results found for any visits on 02/17/24.      Assessment & Plan:   Problem List Items Addressed This Visit   None Visit Diagnoses       Acute left ankle pain    -  Primary   Relevant Orders   DG Ankle Complete Left   Ankle brace     Left Ankle X-ray pending. Ankle brace provided.  Recommend conservative management for ankle pain, including RICE  (rest, ice, compression, elevation) and use of over-the-counter Tylenol  or Ibuprofen  as needed for pain relief. Monitor symptoms and return to clinic if pain worsens or persists.  No orders of the defined types were placed in this encounter.   No follow-ups on file.  Despina Boan L Marian Meneely, NP

## 2024-05-05 ENCOUNTER — Encounter: Payer: Self-pay | Admitting: Family Medicine

## 2024-05-11 ENCOUNTER — Encounter: Payer: Self-pay | Admitting: Obstetrics & Gynecology

## 2024-05-21 ENCOUNTER — Telehealth: Payer: Self-pay

## 2024-05-21 NOTE — Telephone Encounter (Signed)
 RN spoke with [facility]/Tonya regarding patient questions about KIND patches. Per pharmacy, ingredients and combination of ingredients in each patch could not be fully verified with recommendation to not utilize while breastfeeding.   Silvano LELON Piano, RN

## 2024-07-05 ENCOUNTER — Encounter: Payer: Self-pay | Admitting: Family Medicine

## 2024-07-09 NOTE — Progress Notes (Signed)
 "  Established Patient Office Visit Subjective:  Patient ID: Brianna Acosta, female    DOB: [birth year]  Age: 34 y.o. MRN: [id number]  CC: No chief complaint on file.     HPI Brianna Acosta is here for difficulty losing weight postpartum.    Weight Gain: - She notes that she's experienced difficulty losing weight post-partum, despite her diet being better.  - Is established with an OBGYN.  - Medications: has tried Supergut GLP1 booster (a prebiotic blend to boost GLP1 naturally) - Compliance: *** - Diet: *** - Exercise: *** Wt Readings from Last 5 Encounters:  02/17/24 160 lb 3.2 oz (72.7 kg)  09/09/23 152 lb (68.9 kg)  07/12/23 153 lb 9.6 oz (69.7 kg)  05/27/23 152 lb (68.9 kg)  10/18/22 153 lb (69.4 kg)      Past Medical History:  Diagnosis Date   Anemia    Mosaic Turner syndrome    Placenta previa     Past Surgical History:  Procedure Laterality Date   ADENOIDECTOMY     APPENDECTOMY     CESAREAN SECTION     CESAREAN SECTION N/A 03/30/2022   Procedure: CESAREAN SECTION;  Surgeon: Barbra Lang PARAS, DO;  Location: [facility];  Service: Obstetrics;  Laterality: N/A;    Family History  Problem Relation Age of Onset   Diabetes Mother    Hyperthyroidism Mother    Hypertension Father    Cancer Maternal Grandfather     Social History   Socioeconomic History   Marital status: Married    Spouse name: Not on file   Number of children: Not on file   Years of education: Not on file   Highest education level: Bachelor's degree (e.g., BA, AB, BS)  Occupational History   Not on file  Tobacco Use   Smoking status: Never    Passive exposure: Never   Smokeless tobacco: Never  Vaping Use   Vaping status: Never Used  Substance and Sexual Activity   Alcohol use: Not Currently   Drug use: Never   Sexual activity: Yes    Partners: Male    Birth control/protection: I.U.D.  Other Topics Concern   Not on file  Social History Narrative   Not on file   Social Drivers of  Health   Tobacco Use: Low Risk (02/17/2024)   Patient History    Smoking Tobacco Use: Never    Smokeless Tobacco Use: Never    Passive Exposure: Never  Financial Resource Strain: Low Risk (07/11/2023)   Overall Financial Resource Strain (CARDIA)    Difficulty of Paying Living Expenses: Not hard at all  Food Insecurity: No Food Insecurity (07/11/2023)   Hunger Vital Sign    Worried About Running Out of Food in the Last Year: Never true    Ran Out of Food in the Last Year: Never true  Transportation Needs: No Transportation Needs (07/11/2023)   PRAPARE - Administrator, Civil Service (Medical): No    Lack of Transportation (Non-Medical): No  Physical Activity: Sufficiently Active (07/11/2023)   Exercise Vital Sign    Days of Exercise per Week: 4 days    Minutes of Exercise per Session: 50 min  Stress: No Stress Concern Present (07/11/2023)   [employer] of Occupational Health - Occupational Stress Questionnaire    Feeling of Stress : Not at all  Social Connections: Socially Integrated (07/11/2023)   Social Connection and Isolation Panel    Frequency of Communication with Friends and Family: More than three  times a week    Frequency of Social Gatherings with Friends and Family: Once a week    Attends Religious Services: More than 4 times per year    Active Member of [employer] or Organizations: Yes    Attends Engineer, Structural: More than 4 times per year    Marital Status: Married  Catering Manager Violence: Not At Risk (03/30/2022)   Humiliation, Afraid, Rape, and Kick questionnaire    Fear of Current or Ex-Partner: No    Emotionally Abused: No    Physically Abused: No    Sexually Abused: No  Depression (PHQ2-9): Low Risk (02/17/2024)   Depression (PHQ2-9)    PHQ-2 Score: 0  Alcohol Screen: Low Risk (07/11/2023)   Alcohol Screen    Last Alcohol Screening Score (AUDIT): 2  Housing: Low Risk (07/11/2023)   Housing Stability Vital Sign    Unable to Pay for  Housing in the Last Year: No    Number of Times Moved in the Last Year: 0    Homeless in the Last Year: No  Utilities: Not At Risk (03/30/2022)   AHC Utilities    Threatened with loss of utilities: No  Health Literacy: Not on file    ROS All ROS negative except what is listed in the HPI.   Objective:   Today's Vitals: There were no vitals taken for this visit.  Physical Exam  Assessment & Plan:   Problem List Items Addressed This Visit   None     Follow-up: No follow-ups on file.   Waddell FURY Almarie, DNP, FNP-C  I,Emily Lagle,acting as a neurosurgeon for Waddell KATHEE Almarie, NP.,have documented all relevant documentation on the behalf of Waddell KATHEE Almarie, NP.  I, Waddell KATHEE Almarie, NP, have reviewed all documentation for this visit. The documentation on 07/10/2024 for the exam, diagnosis, procedures, and orders are all accurate and complete.  "

## 2024-07-10 ENCOUNTER — Encounter: Payer: Self-pay | Admitting: Family Medicine

## 2024-07-10 ENCOUNTER — Ambulatory Visit: Admitting: Family Medicine

## 2024-07-10 VITALS — BP 121/80 | HR 85 | Ht 64.5 in | Wt 169.0 lb

## 2024-07-10 DIAGNOSIS — Z1331 Encounter for screening for depression: Secondary | ICD-10-CM | POA: Diagnosis not present

## 2024-07-10 DIAGNOSIS — R5383 Other fatigue: Secondary | ICD-10-CM | POA: Diagnosis not present

## 2024-07-10 DIAGNOSIS — R635 Abnormal weight gain: Secondary | ICD-10-CM | POA: Diagnosis not present

## 2024-07-10 LAB — CBC WITH DIFFERENTIAL/PLATELET
Basophils Absolute: 0 K/uL (ref 0.0–0.1)
Basophils Relative: 0.5 % (ref 0.0–3.0)
Eosinophils Absolute: 0.4 K/uL (ref 0.0–0.7)
Eosinophils Relative: 5.9 % — ABNORMAL HIGH (ref 0.0–5.0)
HCT: 40.9 % (ref 36.0–46.0)
Hemoglobin: 14.1 g/dL (ref 12.0–15.0)
Lymphocytes Relative: 26.5 % (ref 12.0–46.0)
Lymphs Abs: 2 K/uL (ref 0.7–4.0)
MCHC: 34.5 g/dL (ref 30.0–36.0)
MCV: 87.2 fl (ref 78.0–100.0)
Monocytes Absolute: 0.8 K/uL (ref 0.1–1.0)
Monocytes Relative: 10.5 % (ref 3.0–12.0)
Neutro Abs: 4.2 K/uL (ref 1.4–7.7)
Neutrophils Relative %: 56.6 % (ref 43.0–77.0)
Platelets: 327 K/uL (ref 150.0–400.0)
RBC: 4.69 Mil/uL (ref 3.87–5.11)
RDW: 12.2 % (ref 11.5–15.5)
WBC: 7.5 K/uL (ref 4.0–10.5)

## 2024-07-10 LAB — COMPREHENSIVE METABOLIC PANEL WITH GFR
ALT: 20 U/L (ref 3–35)
AST: 19 U/L (ref 5–37)
Albumin: 4.3 g/dL (ref 3.5–5.2)
Alkaline Phosphatase: 39 U/L (ref 39–117)
BUN: 14 mg/dL (ref 6–23)
CO2: 30 meq/L (ref 19–32)
Calcium: 9.5 mg/dL (ref 8.4–10.5)
Chloride: 104 meq/L (ref 96–112)
Creatinine, Ser: 0.69 mg/dL (ref 0.40–1.20)
GFR: 114.33 mL/min
Glucose, Bld: 94 mg/dL (ref 70–99)
Potassium: 4.5 meq/L (ref 3.5–5.1)
Sodium: 138 meq/L (ref 135–145)
Total Bilirubin: 1.6 mg/dL — ABNORMAL HIGH (ref 0.2–1.2)
Total Protein: 6.7 g/dL (ref 6.0–8.3)

## 2024-07-10 LAB — LIPID PANEL
Cholesterol: 158 mg/dL (ref 28–200)
HDL: 55.7 mg/dL
LDL Cholesterol: 76 mg/dL (ref 10–99)
NonHDL: 101.91
Total CHOL/HDL Ratio: 3
Triglycerides: 132 mg/dL (ref 10.0–149.0)
VLDL: 26.4 mg/dL (ref 0.0–40.0)

## 2024-07-10 LAB — IBC + FERRITIN
Ferritin: 53.7 ng/mL (ref 10.0–291.0)
Iron: 129 ug/dL (ref 42–145)
Saturation Ratios: 33.3 % (ref 20.0–50.0)
TIBC: 387.8 ug/dL (ref 250.0–450.0)
Transferrin: 277 mg/dL (ref 212.0–360.0)

## 2024-07-10 LAB — PROLACTIN: Prolactin: 5 ng/mL

## 2024-07-10 LAB — HEMOGLOBIN A1C: Hgb A1c MFr Bld: 5.5 % (ref 4.6–6.5)

## 2024-07-10 LAB — TSH: TSH: 3.18 u[IU]/mL (ref 0.35–5.50)

## 2024-07-10 LAB — B12 AND FOLATE PANEL
Folate: >23.4 ng/mL
Vitamin B-12: 763 pg/mL (ref 211–911)

## 2024-07-10 LAB — T4, FREE: Free T4: 0.79 ng/dL (ref 0.60–1.60)

## 2024-07-11 ENCOUNTER — Ambulatory Visit: Payer: Self-pay | Admitting: Family Medicine

## 2024-07-11 DIAGNOSIS — R5383 Other fatigue: Secondary | ICD-10-CM

## 2024-07-11 DIAGNOSIS — R17 Unspecified jaundice: Secondary | ICD-10-CM

## 2024-07-11 DIAGNOSIS — R635 Abnormal weight gain: Secondary | ICD-10-CM

## 2024-07-25 ENCOUNTER — Encounter (INDEPENDENT_AMBULATORY_CARE_PROVIDER_SITE_OTHER): Payer: Self-pay
